# Patient Record
Sex: Male | Born: 1950 | Race: White | Hispanic: No | Marital: Married | Smoking: Current some day smoker
Health system: Southern US, Community
[De-identification: ages and names within clinical notes are randomized; demographics above are authoritative.]

## PROBLEM LIST (undated history)

## (undated) HISTORY — PX: APPENDECTOMY: SHX54

---

## 2016-06-05 DIAGNOSIS — R7689 Other specified abnormal immunological findings in serum: Secondary | ICD-10-CM

## 2016-06-05 DIAGNOSIS — M2550 Pain in unspecified joint: Secondary | ICD-10-CM

## 2016-06-05 DIAGNOSIS — R768 Other specified abnormal immunological findings in serum: Secondary | ICD-10-CM | POA: Insufficient documentation

## 2016-06-05 HISTORY — DX: Other specified abnormal immunological findings in serum: R76.89

## 2016-06-05 HISTORY — DX: Pain in unspecified joint: M25.50

## 2019-05-16 ENCOUNTER — Encounter (HOSPITAL_BASED_OUTPATIENT_CLINIC_OR_DEPARTMENT_OTHER): Payer: Self-pay | Admitting: *Deleted

## 2019-05-16 ENCOUNTER — Emergency Department (HOSPITAL_BASED_OUTPATIENT_CLINIC_OR_DEPARTMENT_OTHER): Payer: Medicare Other

## 2019-05-16 ENCOUNTER — Emergency Department (HOSPITAL_BASED_OUTPATIENT_CLINIC_OR_DEPARTMENT_OTHER)
Admission: EM | Admit: 2019-05-16 | Discharge: 2019-05-16 | Disposition: A | Discharge status: Home / Self Care | Payer: Medicare Other | Attending: Emergency Medicine | Admitting: Emergency Medicine

## 2019-05-16 ENCOUNTER — Other Ambulatory Visit: Payer: Self-pay

## 2019-05-16 DIAGNOSIS — Y999 Unspecified external cause status: Secondary | ICD-10-CM | POA: Diagnosis not present

## 2019-05-16 DIAGNOSIS — W19XXXA Unspecified fall, initial encounter: Secondary | ICD-10-CM | POA: Diagnosis not present

## 2019-05-16 DIAGNOSIS — S0990XA Unspecified injury of head, initial encounter: Secondary | ICD-10-CM | POA: Insufficient documentation

## 2019-05-16 DIAGNOSIS — Y939 Activity, unspecified: Secondary | ICD-10-CM | POA: Insufficient documentation

## 2019-05-16 DIAGNOSIS — Y92003 Bedroom of unspecified non-institutional (private) residence as the place of occurrence of the external cause: Secondary | ICD-10-CM | POA: Insufficient documentation

## 2019-05-16 DIAGNOSIS — S0033XA Contusion of nose, initial encounter: Secondary | ICD-10-CM | POA: Diagnosis not present

## 2019-05-16 DIAGNOSIS — F10929 Alcohol use, unspecified with intoxication, unspecified: Secondary | ICD-10-CM

## 2019-05-16 DIAGNOSIS — F10220 Alcohol dependence with intoxication, uncomplicated: Secondary | ICD-10-CM | POA: Diagnosis not present

## 2019-05-16 DIAGNOSIS — Y908 Blood alcohol level of 240 mg/100 ml or more: Secondary | ICD-10-CM | POA: Diagnosis not present

## 2019-05-16 DIAGNOSIS — Z79899 Other long term (current) drug therapy: Secondary | ICD-10-CM | POA: Insufficient documentation

## 2019-05-16 LAB — CBC WITH DIFFERENTIAL/PLATELET
Abs Immature Granulocytes: 0.01 10*3/uL (ref 0.00–0.07)
Basophils Absolute: 0.1 10*3/uL (ref 0.0–0.1)
Basophils Relative: 2 %
Eosinophils Absolute: 0.2 10*3/uL (ref 0.0–0.5)
Eosinophils Relative: 3 %
HCT: 40.4 % (ref 39.0–52.0)
Hemoglobin: 14 g/dL (ref 13.0–17.0)
Immature Granulocytes: 0 %
Lymphocytes Relative: 52 %
Lymphs Abs: 2.5 10*3/uL (ref 0.7–4.0)
MCH: 31.7 pg (ref 26.0–34.0)
MCHC: 34.7 g/dL (ref 30.0–36.0)
MCV: 91.6 fL (ref 80.0–100.0)
Monocytes Absolute: 0.5 10*3/uL (ref 0.1–1.0)
Monocytes Relative: 10 %
Neutro Abs: 1.6 10*3/uL — ABNORMAL LOW (ref 1.7–7.7)
Neutrophils Relative %: 33 %
Platelets: 180 10*3/uL (ref 150–400)
RBC: 4.41 MIL/uL (ref 4.22–5.81)
RDW: 12.9 % (ref 11.5–15.5)
WBC: 4.8 10*3/uL (ref 4.0–10.5)
nRBC: 0 % (ref 0.0–0.2)

## 2019-05-16 LAB — COMPREHENSIVE METABOLIC PANEL
ALT: 27 U/L (ref 0–44)
AST: 32 U/L (ref 15–41)
Albumin: 3.7 g/dL (ref 3.5–5.0)
Alkaline Phosphatase: 41 U/L (ref 38–126)
Anion gap: 10 (ref 5–15)
BUN: 14 mg/dL (ref 8–23)
CO2: 27 mmol/L (ref 22–32)
Calcium: 8.4 mg/dL — ABNORMAL LOW (ref 8.9–10.3)
Chloride: 105 mmol/L (ref 98–111)
Creatinine, Ser: 0.67 mg/dL (ref 0.61–1.24)
GFR calc Af Amer: >60 mL/min (ref >60)
GFR calc non Af Amer: >60 mL/min (ref >60)
Glucose, Bld: 84 mg/dL (ref 70–99)
Potassium: 3.6 mmol/L (ref 3.5–5.1)
Sodium: 142 mmol/L (ref 135–145)
Total Bilirubin: 0.6 mg/dL (ref 0.3–1.2)
Total Protein: 6.8 g/dL (ref 6.5–8.1)

## 2019-05-16 LAB — URINALYSIS, ROUTINE W REFLEX MICROSCOPIC
Bilirubin Urine: NEGATIVE
Glucose, UA: NEGATIVE mg/dL
Hgb urine dipstick: NEGATIVE
Ketones, ur: NEGATIVE mg/dL
Leukocytes,Ua: NEGATIVE
Nitrite: NEGATIVE
Protein, ur: NEGATIVE mg/dL
Specific Gravity, Urine: >1.03 — ABNORMAL HIGH (ref 1.005–1.030)
pH: 5.5 (ref 5.0–8.0)

## 2019-05-16 LAB — TROPONIN I (HIGH SENSITIVITY): Troponin I (High Sensitivity): 5 ng/L (ref <18)

## 2019-05-16 LAB — RAPID URINE DRUG SCREEN, HOSP PERFORMED
Amphetamines: NOT DETECTED
Barbiturates: NOT DETECTED
Benzodiazepines: NOT DETECTED
Cocaine: NOT DETECTED
Opiates: NOT DETECTED
Tetrahydrocannabinol: NOT DETECTED

## 2019-05-16 LAB — CK: Total CK: 125 U/L (ref 49–397)

## 2019-05-16 LAB — ETHANOL: Alcohol, Ethyl (B): 267 mg/dL — ABNORMAL HIGH (ref <10)

## 2019-05-16 NOTE — ED Provider Notes (Signed)
[facility] EMERGENCY DEPARTMENT Provider Note   CSN: [id number] Arrival date & time: 05/16/19  2027   History Chief Complaint  Patient presents with  . Fall    Jeffery Browning is a 69 y.o. male w/ a PMHx of psoriatic arthritis who presents to the ED after a fall.  Patient's wife was present at bedside to assist with history.  Jeffery Browning states he fell yesterday morning but cannot recall anything before, during or afterwards.  He remembers feeling at baseline on Sunday (2 days ago). He notes that the fall occurred near his bed, as that is where blood was found.  He believes he hit with his head down on the floor.  He notes a history of daily alcohol use for at least 30 years but denies ever needing eye-opener, DUI, arrests, or his drinking affecting his home/work life.  He denies any drug use.  No prior history of similar falls.   Jeffery Browning states she left the house around 6:30 AM, at which time Mr. Comer was eating breakfast at the table.  She was concerned when she did not see any movement within the house via the Ring app on her phone.  She called her son to go check up on Jeffery Browning.  At 11 AM, her son found Jeffery Browning on the bed with a bloody nose in a puddle of blood next to the bed.  [name] was confused at the time.  After speaking to a physician today, family was instructed to bring him to the ED for evaluation.  Patient's wife notes patient was shuffling gait earlier that has since resolved.  Patient denies fever, chills, shortness of breath, chest pain, facial pain, headache, blurry vision, weakness/numbness, urinary changes, N/V/D, abdominal pain.  Past Medical History:  - Psoriatic Arthritis   Past Surgical History:  Procedure Laterality Date  . APPENDECTOMY      Social History: Alcohol: Daily use of beer and wine, unable to quantify how many drinks per day. He has been drinking daily for +30 years.  Denies hx of withdrawals Tobacco: Several  cigars per week. No other tobacco use.  Drugs: Denies  Lives at home with wife.   Home Medications Prior to Admission medications   Not on File    Allergies    Patient has no known allergies.  Review of Systems   Review of Systems  Constitutional: Negative for chills and fever.  HENT: Positive for nosebleeds.   Respiratory: Negative for shortness of breath and wheezing.   Cardiovascular: Negative for chest pain, palpitations and leg swelling.  Gastrointestinal: Negative for abdominal pain, diarrhea, nausea and vomiting.  Genitourinary: Negative for difficulty urinating, dysuria and hematuria.  Musculoskeletal: Negative for back pain, myalgias, neck pain and neck stiffness.  Skin: Positive for wound (head abrasion, nose bruise).  Neurological: Positive for syncope. Negative for seizures, speech difficulty, weakness, light-headedness, numbness and headaches.  Psychiatric/Behavioral: Positive for confusion.   Physical Exam Updated Vital Signs BP (!) 136/93   Pulse 80   Temp 98.8 F (37.1 C) (Oral)   Resp 14   Ht 5\' 9"  (1.753 m)   Wt 90.7 kg   SpO2 94%   BMI 29.53 kg/m   Physical Exam Vitals and nursing note reviewed.  Constitutional:      General: He is not in acute distress.    Appearance: He is normal weight. He is not toxic-appearing.  HENT:     Head: Abrasion (0.5 x 3 cm abrasion without active  bleeding) present. No raccoon eyes, right periorbital erythema or left periorbital erythema.     Nose: Signs of injury and nasal tenderness present. No laceration.     Right Nostril: Epistaxis (minimal) present. No occlusion.     Left Nostril: Epistaxis (dried blood throughout nare) present. No occlusion.     Right Sinus: No maxillary sinus tenderness or frontal sinus tenderness.     Left Sinus: No maxillary sinus tenderness or frontal sinus tenderness.     Mouth/Throat:     Mouth: Mucous membranes are moist. No injury, lacerations or oral lesions.     Tongue: No lesions.  Tongue does not deviate from midline.     Palate: No lesions.     Pharynx: Oropharynx is clear. No oropharyngeal exudate.  Eyes:     Extraocular Movements: Extraocular movements intact.     Conjunctiva/sclera: Conjunctivae normal.     Pupils: Pupils are equal, round, and reactive to light.  Cardiovascular:     Rate and Rhythm: Normal rate and regular rhythm.     Heart sounds: No murmur.  Pulmonary:     Effort: Pulmonary effort is normal. No respiratory distress.     Breath sounds: Normal breath sounds. No wheezing, rhonchi or rales.  Chest:     Chest wall: No tenderness.  Abdominal:     General: Bowel sounds are normal. There is no distension.     Palpations: Abdomen is soft.  Musculoskeletal:        General: No signs of injury.     Right lower leg: No edema.     Left lower leg: No edema.  Skin:    General: Skin is warm and dry.     Coloration: Skin is not jaundiced or pale.     Findings: Bruising (bruise on nasal bridge) present.  Neurological:     Mental Status: He is alert. He is confused.     Cranial Nerves: Cranial nerves are intact. No cranial nerve deficit or facial asymmetry.     Sensory: Sensation is intact.     Motor: Motor function is intact. No weakness (5/5 strength bilateral upper and lower extremities in both flexion and extension), abnormal muscle tone or seizure activity.     Comments: Oriented to person, place, month but not year. Confused and unable to recall the events from yesterday  Psychiatric:        Mood and Affect: Mood normal.        Behavior: Behavior normal.    ED Results / Procedures / Treatments   Labs (all labs ordered are listed, but only abnormal results are displayed) Labs Reviewed  COMPREHENSIVE METABOLIC PANEL - Abnormal; Notable for the following components:      Result Value   Calcium 8.4 (*)    All other components within normal limits  ETHANOL - Abnormal; Notable for the following components:   Alcohol, Ethyl (B) 267 (*)    All  other components within normal limits  CBC WITH DIFFERENTIAL/PLATELET - Abnormal; Notable for the following components:   Neutro Abs 1.6 (*)    All other components within normal limits  URINALYSIS, ROUTINE W REFLEX MICROSCOPIC - Abnormal; Notable for the following components:   Specific Gravity, Urine >1.030 (*)    All other components within normal limits  RAPID URINE DRUG SCREEN, HOSP PERFORMED  CK  TROPONIN I (HIGH SENSITIVITY)  TROPONIN I (HIGH SENSITIVITY)   EKG EKG Interpretation  Date/Time:  Tuesday May 16 2019 21:12:16 EST Ventricular Rate:  78 PR  Interval:    QRS Duration: 104 QT Interval:  416 QTC Calculation: 474 R Axis:   53 Text Interpretation: Sinus rhythm No STEMI Confirmed by Alona Bene [phone]) on 05/16/2019 9:15:25 PM   Radiology DG Chest 2 View  Result Date: 05/16/2019 CLINICAL DATA:  Un witnessed fall yesterday, facial laceration and bruising EXAM: CHEST - 2 VIEW COMPARISON:  None. FINDINGS: The heart size and mediastinal contours are within normal limits. Both lungs are clear. The visualized skeletal structures are unremarkable. IMPRESSION: No active cardiopulmonary disease. Electronically Signed   By: Sharlet Salina M.D.   On: 05/16/2019 21:42   CT Head Wo Contrast  Result Date: 05/16/2019 CLINICAL DATA:  Unwitnessed fall EXAM: CT HEAD WITHOUT CONTRAST TECHNIQUE: Contiguous axial images were obtained from the base of the skull through the vertex without intravenous contrast. COMPARISON:  None. FINDINGS: Brain: No evidence of acute territorial infarction, hemorrhage, hydrocephalus,extra-axial collection or mass lesion/mass effect. There is dilatation the ventricles and sulci, more prominent in the frontotemporal lobes. Low-attenuation changes in the deep white matter consistent with small vessel ischemia. Vascular: No hyperdense vessel or unexpected calcification. Skull: The skull is intact. No fracture or focal lesion identified. Sinuses/Orbits: The visualized  paranasal sinuses and mastoid air cells are clear. The orbits and globes intact. Other: None Face: Osseous: No acute fracture or other significant osseous abnormality.The nasal bone, mandibles, zygomatic arches and pterygoid plates are intact. Orbits: No fracture identified. Unremarkable appearance of globes and orbits. Sinuses: The visualized paranasal sinuses and mastoid air cells are unremarkable. Soft tissues:  No acute findings. Limited intracranial: No acute findings. Cervical spine: Alignment: Physiologic Skull base and vertebrae: Visualized skull base is intact. No atlanto-occipital dissociation. The vertebral body heights are well maintained. No fracture or pathologic osseous lesion seen. Soft tissues and spinal canal: The visualized paraspinal soft tissues are unremarkable. No prevertebral soft tissue swelling is seen. The spinal canal is grossly unremarkable, no large epidural collection or significant canal narrowing. Disc levels: Mild cervical spine spondylosis is seen with uncovertebral osteophyte and disc osteophyte complex most notable at C4-C5 with mild neural foraminal narrowing. Upper chest: The lung apices are clear. Thoracic inlet is within normal limits. Other: None IMPRESSION: No acute intracranial abnormality. Findings consistent with age related atrophy, more prominent in the frontotemporal lobes, and chronic small vessel ischemia No acute facial injury No acute fracture or malalignment of the spine. Electronically Signed   By: Jonna Clark M.D.   On: 05/16/2019 21:53   CT Cervical Spine Wo Contrast  Result Date: 05/16/2019 CLINICAL DATA:  Unwitnessed fall EXAM: CT HEAD WITHOUT CONTRAST TECHNIQUE: Contiguous axial images were obtained from the base of the skull through the vertex without intravenous contrast. COMPARISON:  None. FINDINGS: Brain: No evidence of acute territorial infarction, hemorrhage, hydrocephalus,extra-axial collection or mass lesion/mass effect. There is dilatation the  ventricles and sulci, more prominent in the frontotemporal lobes. Low-attenuation changes in the deep white matter consistent with small vessel ischemia. Vascular: No hyperdense vessel or unexpected calcification. Skull: The skull is intact. No fracture or focal lesion identified. Sinuses/Orbits: The visualized paranasal sinuses and mastoid air cells are clear. The orbits and globes intact. Other: None Face: Osseous: No acute fracture or other significant osseous abnormality.The nasal bone, mandibles, zygomatic arches and pterygoid plates are intact. Orbits: No fracture identified. Unremarkable appearance of globes and orbits. Sinuses: The visualized paranasal sinuses and mastoid air cells are unremarkable. Soft tissues:  No acute findings. Limited intracranial: No acute findings. Cervical spine: Alignment: Physiologic Skull  base and vertebrae: Visualized skull base is intact. No atlanto-occipital dissociation. The vertebral body heights are well maintained. No fracture or pathologic osseous lesion seen. Soft tissues and spinal canal: The visualized paraspinal soft tissues are unremarkable. No prevertebral soft tissue swelling is seen. The spinal canal is grossly unremarkable, no large epidural collection or significant canal narrowing. Disc levels: Mild cervical spine spondylosis is seen with uncovertebral osteophyte and disc osteophyte complex most notable at C4-C5 with mild neural foraminal narrowing. Upper chest: The lung apices are clear. Thoracic inlet is within normal limits. Other: None IMPRESSION: No acute intracranial abnormality. Findings consistent with age related atrophy, more prominent in the frontotemporal lobes, and chronic small vessel ischemia No acute facial injury No acute fracture or malalignment of the spine. Electronically Signed   By: Jonna Clark M.D.   On: 05/16/2019 21:53   CT Maxillofacial Wo Contrast  Result Date: 05/16/2019 CLINICAL DATA:  Unwitnessed fall EXAM: CT HEAD WITHOUT  CONTRAST TECHNIQUE: Contiguous axial images were obtained from the base of the skull through the vertex without intravenous contrast. COMPARISON:  None. FINDINGS: Brain: No evidence of acute territorial infarction, hemorrhage, hydrocephalus,extra-axial collection or mass lesion/mass effect. There is dilatation the ventricles and sulci, more prominent in the frontotemporal lobes. Low-attenuation changes in the deep white matter consistent with small vessel ischemia. Vascular: No hyperdense vessel or unexpected calcification. Skull: The skull is intact. No fracture or focal lesion identified. Sinuses/Orbits: The visualized paranasal sinuses and mastoid air cells are clear. The orbits and globes intact. Other: None Face: Osseous: No acute fracture or other significant osseous abnormality.The nasal bone, mandibles, zygomatic arches and pterygoid plates are intact. Orbits: No fracture identified. Unremarkable appearance of globes and orbits. Sinuses: The visualized paranasal sinuses and mastoid air cells are unremarkable. Soft tissues:  No acute findings. Limited intracranial: No acute findings. Cervical spine: Alignment: Physiologic Skull base and vertebrae: Visualized skull base is intact. No atlanto-occipital dissociation. The vertebral body heights are well maintained. No fracture or pathologic osseous lesion seen. Soft tissues and spinal canal: The visualized paraspinal soft tissues are unremarkable. No prevertebral soft tissue swelling is seen. The spinal canal is grossly unremarkable, no large epidural collection or significant canal narrowing. Disc levels: Mild cervical spine spondylosis is seen with uncovertebral osteophyte and disc osteophyte complex most notable at C4-C5 with mild neural foraminal narrowing. Upper chest: The lung apices are clear. Thoracic inlet is within normal limits. Other: None IMPRESSION: No acute intracranial abnormality. Findings consistent with age related atrophy, more prominent in  the frontotemporal lobes, and chronic small vessel ischemia No acute facial injury No acute fracture or malalignment of the spine. Electronically Signed   By: Jonna Clark M.D.   On: 05/16/2019 21:53    Procedures Procedures (including critical care time)  Medications Ordered in ED Medications - No data to display  ED Course  I have reviewed the triage vital signs and the nursing notes.  Pertinent labs & imaging results that were available during my care of the patient were reviewed by me and considered in my medical decision making (see chart for details).    MDM Rules/Calculators/A&P                      Mr. Hineman is a 69 year old male with no significant past medical history who presents to the ED after an unwitnessed fall at home.  Fall occurred yesterday at some point between 6:30-11:00 AM. Patient was found on his bed with bleeding from the  nose and a small puddle of blood near the bed. Both the patient and the family suspect that he rolled off the bed and hit his head face down on the ground.  Differential for patient's fall includes acute alcohol intoxication versus stroke.  No acute neurological findings on examination that support possibility of stroke though; he has 5/5 strength in upper and lower extremities, CN intact, sensation intact. CT Imaging is still pending.  He does have a daily alcohol drinking history so alcohol intoxication most likely. Ethanol is acutely elevated at 267, further supporting memory loss and fall 2/2 to alcohol intoxication. Initial lab work negative for AKI, electrolyte abnormalities and rhabdomyolysis.  @ 22:19  CT head, cervical and maxillofacial are all negative for acute findings. Result of lab work including CBC, troponin, UA were unremarkable. Both fall and amnesia is likely from alcohol intoxication. Discussed with patient and his wife. He has remained hemodynamically stable, so plan to discharge home with alcohol cessation resources and return  precautions available on AVS.     Final Clinical Impression(s) / ED Diagnoses Final diagnoses:  Alcoholic intoxication with complication ([place])  Fall, initial encounter  Injury of head, initial encounter   Rx / DC Orders ED Discharge Orders    None     Dr. Jose Persia Internal Medicine PGY-1  05/16/2019, 10:21 PM    Jose Persia, MD 05/16/19 2222    Margette Fast, MD 05/17/19 2000

## 2019-05-16 NOTE — ED Triage Notes (Signed)
He was last seen normal on Sunday night. Unwitnessed fall yesterday. His son found him in the bed with blood in the floor from fall. Laceration over his right eyebrow. Bruise across his nose. He admits to alcohol use. Wife states he last drank 2 days ago as far as she is aware. He started shuffling his feet while walking today. He has been confused today.

## 2019-05-16 NOTE — Discharge Instructions (Signed)
Substance Abuse Treatment Programs  Intensive Outpatient Programs [facility]     [address]      [place], [place]                   [phone]       [facility] [address] [place], [place] [phone]  [facility]     (Inpatient and outpatient)     [address]           [phone]    [facility] [phone] (Suboxone and Methadone)  [address]      [place], [place] [address]      [phone]       [address] [place], [place] [phone]  [facility] (Outpatient/Inpatient, [place])    (insurance only) [phone]             [facility] (Groups & Residential) [place], [place] [phone]     [facility]     [address]     [place], [place]      [phone]       [facility] (for caregivers and family) [address] [place], [place] [phone]      [facility]      [address], [place], [place] [address]  [phone]       [facility] [address] [place], [place] [address] [phone]  [facility]        [phone]       [facility] [phone]  [facility]             [address]                                         [place], [place]                                                [phone] or [phone]                               [facility] [place] [address] [place], [address] [phone]  [facility]    [address]      [place], [place]     [phone]       [facility] [address] [place], [place] [phone]  [facility]   [address]     [place], [place] [address]     [phone]      Admissions: 8am-3pm M-F  [facility] ([facility] [address] [place],  [place] [phone]  [facility]: Residential Program (90 Days)   [place], [place]      [phone] or [phone]     [facility]: [facility] [place], [place] (Walk in Hours over the weekend or by referral)  [facility] [facility] [address], [place],  [address] [phone]  Crisis Mobile: [facility]:  [phone] (for crisis response 24 hours a day) [facility] Hotline:      [phone] 

## 2021-09-17 IMAGING — CT CT MAXILLOFACIAL W/O CM
3 series · 15 of 47 positions shown, 18 images · non-contrast
Comparison: None.

CLINICAL DATA: Unwitnessed fall

EXAM:
CT HEAD WITHOUT CONTRAST
TECHNIQUE: Contiguous axial images were obtained from the base of the skull
through the vertex without intravenous contrast.

[Series 2: max soft · axial · 0.41mm/px · z∈[+980,+1122]mm · 9 of 83 slices shown, 12 images]
[im 6/83  brain]
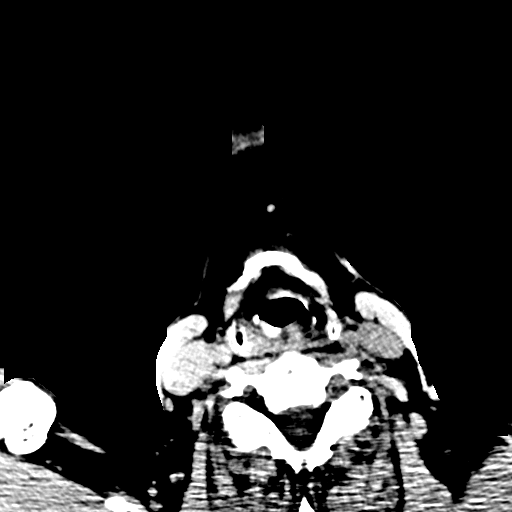
[im 6/83  bone]
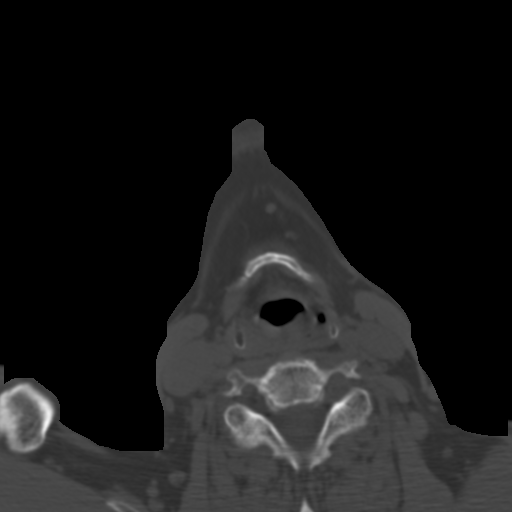
[im 15/83  bone]
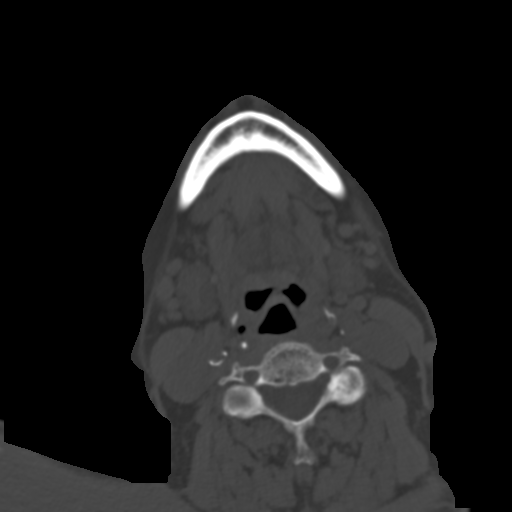
[im 23/83  bone]
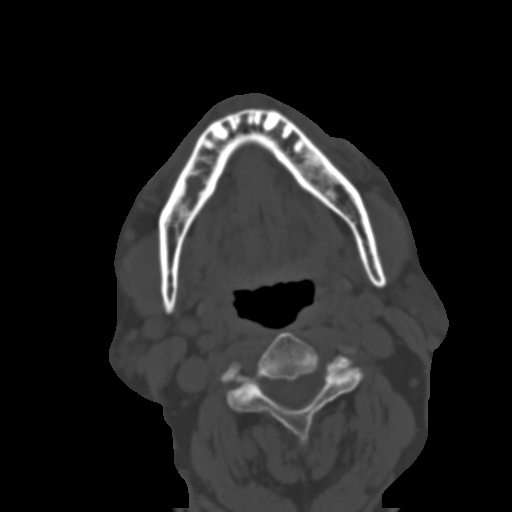
[im 32/83  bone]
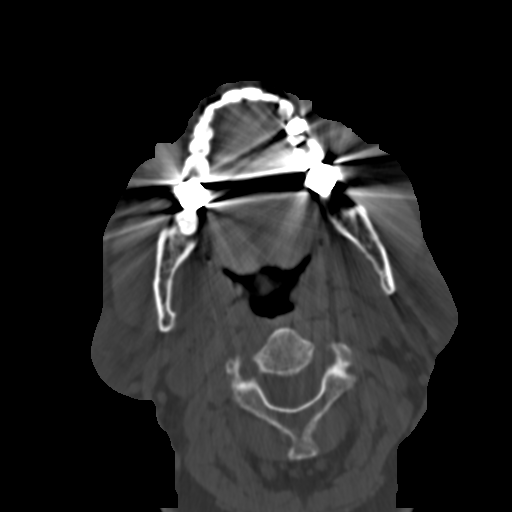
[im 43/83  brain]
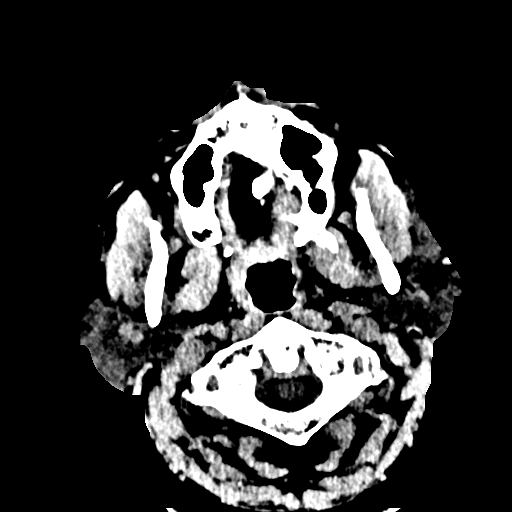
[im 43/83  bone]
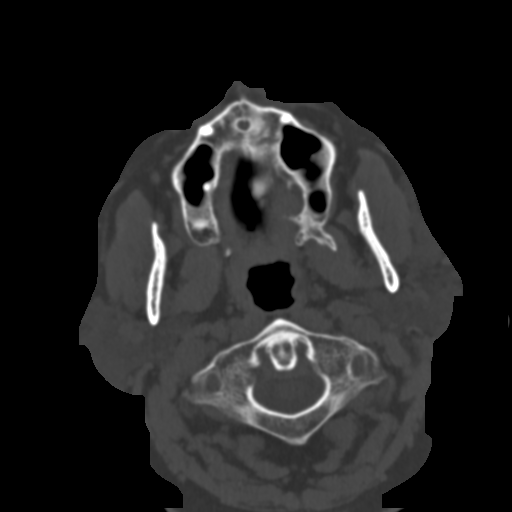
[im 51/83  bone]
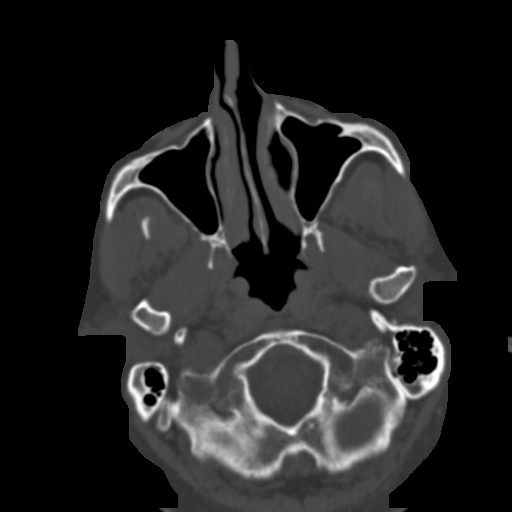
[im 60/83  bone]
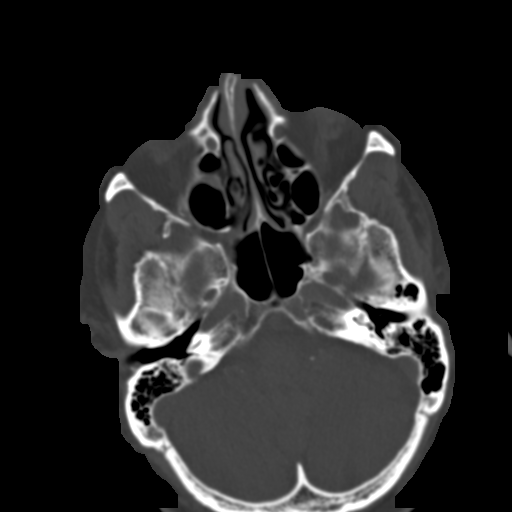
[im 68/83  bone]
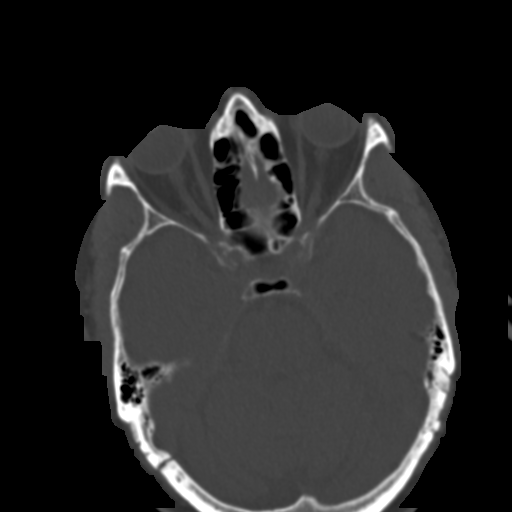
[im 77/83  brain]
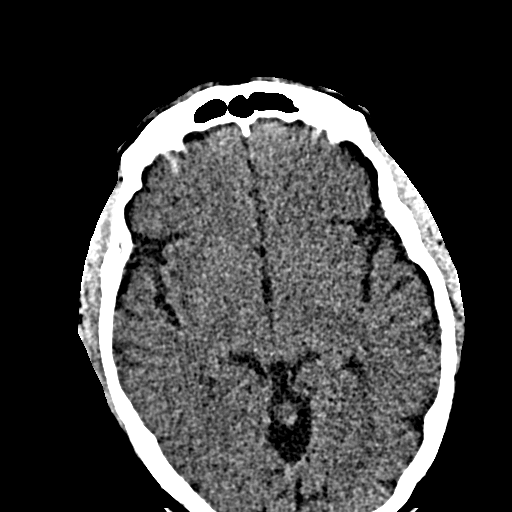
[im 77/83  bone]
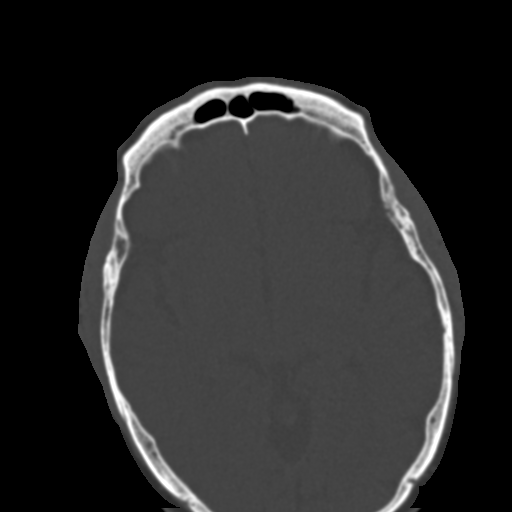

[Series 4: coronal soft · coronal · 0.37mm/px · 3 of 79 slices shown]
[im 35/79  bone]
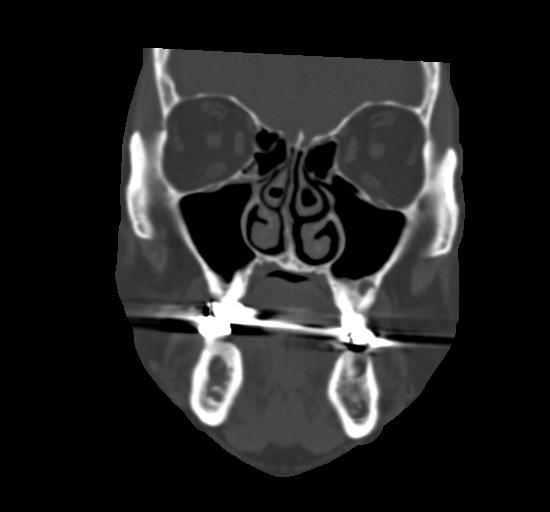
[im 44/79  bone]
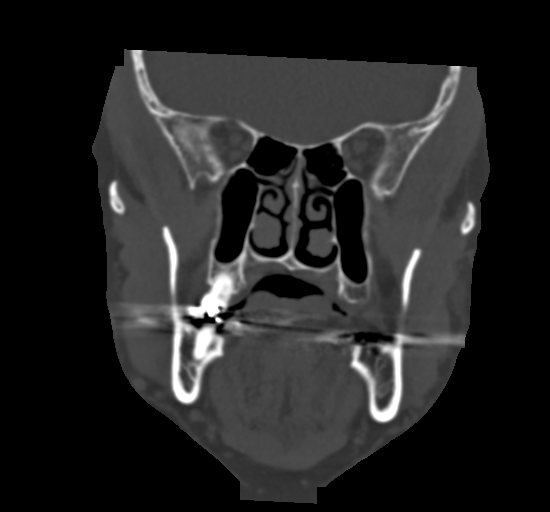
[im 53/79  bone]
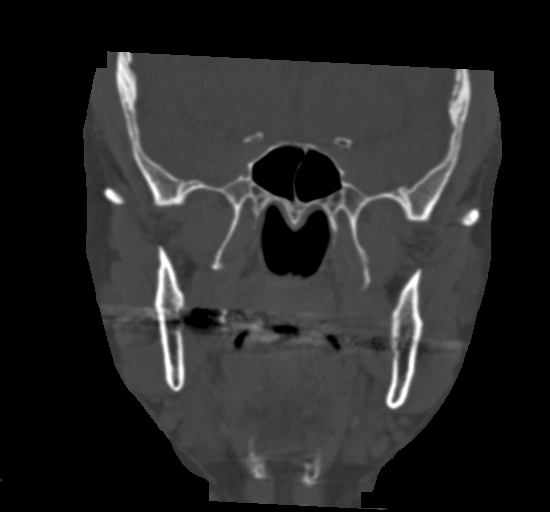

[Series 5: sagittal soft · sagittal · 0.37mm/px · 3 of 103 slices shown]
[im 35/103  bone]
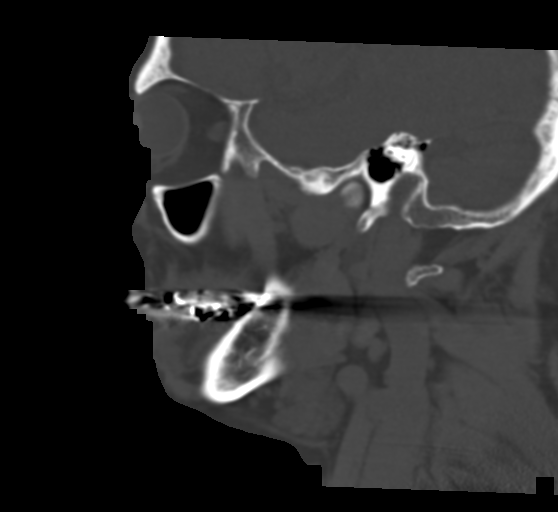
[im 52/103  bone]
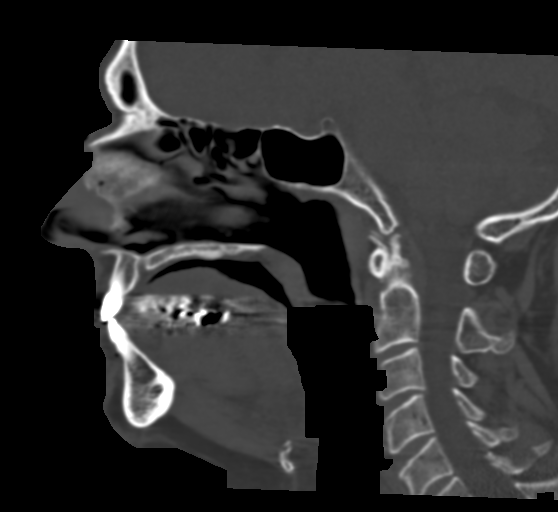
[im 69/103  bone]
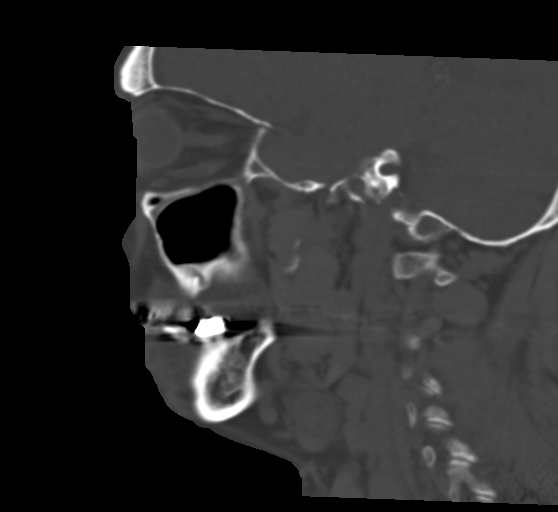

[15 of 47 positions shown; findings below may reference images not displayed]

FINDINGS: Brain: No evidence of acute territorial infarction, hemorrhage,
hydrocephalus,extra-axial collection or mass lesion/mass effect.
There is dilatation the ventricles and sulci, more prominent in the
frontotemporal lobes. Low-attenuation changes in the deep white
matter consistent with small vessel ischemia.

Vascular: No hyperdense vessel or unexpected calcification.

Skull: The skull is intact. No fracture or focal lesion identified.

Sinuses/Orbits: The visualized paranasal sinuses and mastoid air
cells are clear. The orbits and globes intact.

Other: None

Face:

Osseous: No acute fracture or other significant osseous
abnormality.The nasal bone, mandibles, zygomatic arches and
pterygoid plates are intact.

Orbits: No fracture identified. Unremarkable appearance of globes
and orbits.

Sinuses: The visualized paranasal sinuses and mastoid air cells are
unremarkable.

Soft tissues:  No acute findings.

Limited intracranial: No acute findings.

Cervical spine:

Alignment: Physiologic

Skull base and vertebrae: Visualized skull base is intact. No
atlanto-occipital dissociation. The vertebral body heights are well
maintained. No fracture or pathologic osseous lesion seen.

Soft tissues and spinal canal: The visualized paraspinal soft
tissues are unremarkable. No prevertebral soft tissue swelling is
seen. The spinal canal is grossly unremarkable, no large epidural
collection or significant canal narrowing.

Disc levels: Mild cervical spine spondylosis is seen with
uncovertebral osteophyte and disc osteophyte complex most notable at
C4-C5 with mild neural foraminal narrowing.

Upper chest: The lung apices are clear. Thoracic inlet is within
normal limits.

Other: None
IMPRESSION: No acute intracranial abnormality.

Findings consistent with age related atrophy, more prominent in the
frontotemporal lobes, and chronic small vessel ischemia

No acute facial injury

No acute fracture or malalignment of the spine.

## 2023-11-21 ENCOUNTER — Ambulatory Visit (HOSPITAL_BASED_OUTPATIENT_CLINIC_OR_DEPARTMENT_OTHER)
Admission: EM | Admit: 2023-11-21 | Discharge: 2023-11-21 | Disposition: A | Discharge status: Home / Self Care | Attending: Family Medicine | Admitting: Family Medicine

## 2023-11-21 ENCOUNTER — Encounter (HOSPITAL_BASED_OUTPATIENT_CLINIC_OR_DEPARTMENT_OTHER): Payer: Self-pay | Admitting: *Deleted

## 2023-11-21 ENCOUNTER — Ambulatory Visit (INDEPENDENT_AMBULATORY_CARE_PROVIDER_SITE_OTHER)
Admit: 2023-11-21 | Discharge: 2023-11-21 | Disposition: A | Discharge status: Home / Self Care | Attending: Family Medicine | Admitting: Family Medicine

## 2023-11-21 DIAGNOSIS — R5383 Other fatigue: Secondary | ICD-10-CM

## 2023-11-21 LAB — POCT URINE DIPSTICK
Bilirubin, UA: NEGATIVE
Blood, UA: NEGATIVE
Glucose, UA: NEGATIVE mg/dL
Leukocytes, UA: NEGATIVE
Nitrite, UA: NEGATIVE
POC PROTEIN,UA: 100 — AB
Spec Grav, UA: >=1.03 — AB (ref 1.010–1.025)
Urobilinogen, UA: 0.2 U/dL
pH, UA: 5.5 (ref 5.0–8.0)

## 2023-11-21 NOTE — ED Provider Notes (Signed)
 [facility]    CSN: [id number] Arrival date & time: 11/21/23  1101      History   Chief Complaint Chief Complaint  Patient presents with   Fatigue   appeitite loss    HPI Jeffery Browning is a 73 y.o. male.   Patient is a 73 year old gentleman presents today for fatigue, low energy and loss of appetite.  This has been present over the past couple of days.  Denies any known medical history and is not currently taking any medications.  He does admit to drinking a few pints of alcohol weekly.  He has not seen a doctor in a long time or had any recent blood work done.  He denies any associated symptoms to include dizziness, lightheadedness, headache, blurred vision, chest pain, shortness of breath, fever, cough, chest congestion, abdominal pain, back pain, nausea, vomiting or diarrhea.     History reviewed. No pertinent past medical history.  There are no active problems to display for this patient.   Past Surgical History:  Procedure Laterality Date   APPENDECTOMY         Home Medications    Prior to Admission medications   Not on File    Family History History reviewed. No pertinent family history.  Social History Social History   Tobacco Use   Smoking status: Every Day    Types: Cigars   Smokeless tobacco: Never  Vaping Use   Vaping status: Never Used  Substance Use Topics   Alcohol use: Yes    Alcohol/week: 12.0 standard drinks of alcohol    Types: 12 Cans of beer per week    Comment: 12 beers and a couple pints of vodka weekly   Drug use: Never     Allergies   Patient has no known allergies.   Review of Systems Review of Systems See HPI  Physical Exam Triage Vital Signs ED Triage Vitals  Encounter Vitals Group     BP 11/21/23 1122 119/77     Girls Systolic BP Percentile --      Girls Diastolic BP Percentile --      Boys Systolic BP Percentile --      Boys Diastolic BP Percentile --      Pulse Rate 11/21/23 1122 75     Resp  11/21/23 1122 20     Temp 11/21/23 1122 98.3 F (36.8 C)     Temp Source 11/21/23 1122 Oral     SpO2 11/21/23 1122 95 %     Weight 11/21/23 1119 209 lb 3.2 oz (94.9 kg)     Height 11/21/23 1119 5' 10 (1.778 m)     Head Circumference --      Peak Flow --      Pain Score 11/21/23 1118 0     Pain Loc --      Pain Education --      Exclude from Growth Chart --    No data found.  Updated Vital Signs BP 119/77 (BP Location: Right Arm)   Pulse 75   Temp 98.3 F (36.8 C) (Oral)   Resp 20   Ht 5' 10 (1.778 m)   Wt 209 lb 3.2 oz (94.9 kg)   SpO2 95%   BMI 30.02 kg/m   Visual Acuity Right Eye Distance:   Left Eye Distance:   Bilateral Distance:    Right Eye Near:   Left Eye Near:    Bilateral Near:     Physical Exam Vitals and nursing note  reviewed.  Constitutional:      General: He is not in acute distress.    Appearance: Normal appearance. He is not ill-appearing, toxic-appearing or diaphoretic.  HENT:     Nose: Nose normal.     Mouth/Throat:     Pharynx: Oropharynx is clear.  Eyes:     General: No scleral icterus.    Extraocular Movements: Extraocular movements intact.     Pupils: Pupils are equal, round, and reactive to light.     Comments: Bilateral scleral injection   Cardiovascular:     Rate and Rhythm: Normal rate and regular rhythm.     Pulses: Normal pulses.     Heart sounds: Normal heart sounds.  Pulmonary:     Effort: Pulmonary effort is normal.     Breath sounds: Normal breath sounds.  Abdominal:     Palpations: Abdomen is soft.     Tenderness: There is no abdominal tenderness.  Musculoskeletal:        General: Normal range of motion.     Cervical back: Normal range of motion.  Lymphadenopathy:     Cervical: No cervical adenopathy.  Skin:    General: Skin is warm and dry.     Coloration: Skin is not jaundiced.  Neurological:     General: No focal deficit present.     Mental Status: He is alert.  Psychiatric:        Mood and Affect: Mood  normal.      UC Treatments / Results  Labs (all labs ordered are listed, but only abnormal results are displayed) Labs Reviewed  POCT URINE DIPSTICK - Abnormal; Notable for the following components:      Result Value   Ketones, POC UA moderate (40) (*)    Spec Grav, UA >=1.030 (*)    POC PROTEIN,UA =100 (*)    All other components within normal limits  COMPREHENSIVE METABOLIC PANEL WITH GFR  CBC WITH DIFFERENTIAL/PLATELET  TSH    EKG   Radiology No results found.  Procedures Procedures (including critical care time)  Medications Ordered in UC Medications - No data to display  Initial Impression / Assessment and Plan / UC Course  I have reviewed the triage vital signs and the nursing notes.  Pertinent labs & imaging results that were available during my care of the patient were reviewed by me and considered in my medical decision making (see chart for details).     Fatigue and loss of appetite-patient presents with fatigue and loss of appetite over the past 2 to 3 days.  Denies any other concerning signs or symptoms.  No red flags on exam.  Patient is an alcoholic.  Urine here was not infected but he does appear to be dehydrated.  Recommend increase fluid intake and electrolyte intake. Chest x-ray without any concern for infection but did mention hiatal hernia Checking CBC, CMP and TSH. Patient has not been to the doctor in a long time nor had any blood work done.  His vital signs are normal here today with normal blood pressure. Patient needs follow-up and healthcare maintenance with primary care doctor.  Information given to schedule appointment to follow-up with them for further management and workup if needed. Final Clinical Impressions(s) / UC Diagnoses   Final diagnoses:  Other fatigue     Discharge Instructions      Your urine did not show any infection but you do appear to be dehydrated.  Make sure you are drinking plenty of water and electrolytes.  You  may want to start taking some B vitamins and thiamine due to potential vitamin deficiency from drinking alcohol. We are obtaining some lab work and we will call you with any concerning results. I recommend that you follow-up with a primary care for continuous care. I will call you if there is anything concerning on your chest x-ray.    ED Prescriptions   None    PDMP not reviewed this encounter.   Adah Wilbert LABOR, FNP 11/21/23 1314

## 2023-11-21 NOTE — Discharge Instructions (Signed)
 Your urine did not show any infection but you do appear to be dehydrated.  Make sure you are drinking plenty of water and electrolytes.  You may want to start taking some B vitamins and thiamine due to potential vitamin deficiency from drinking alcohol. We are obtaining some lab work and we will call you with any concerning results. I recommend that you follow-up with a primary care for continuous care. I will call you if there is anything concerning on your chest x-ray.

## 2023-11-21 NOTE — ED Triage Notes (Signed)
 Patient states fatigue and loss of energy and appetite for 3 days.  States he will get food but can't eat it, he's having trouble stating why.  Denies pain or nausea

## 2023-11-24 ENCOUNTER — Ambulatory Visit (HOSPITAL_COMMUNITY): Payer: Self-pay

## 2023-11-24 LAB — CBC WITH DIFFERENTIAL/PLATELET
Basophils Absolute: 0.1 x10E3/uL (ref 0.0–0.2)
Basos: 1 %
EOS (ABSOLUTE): 0 x10E3/uL (ref 0.0–0.4)
Eos: 1 %
Hematocrit: 45.7 % (ref 37.5–51.0)
Hemoglobin: 14.2 g/dL (ref 13.0–17.7)
Immature Grans (Abs): 0.1 x10E3/uL (ref 0.0–0.1)
Immature Granulocytes: 1 %
Lymphocytes Absolute: 1 x10E3/uL (ref 0.7–3.1)
Lymphs: 23 %
MCH: 30.7 pg (ref 26.6–33.0)
MCHC: 31.1 g/dL — ABNORMAL LOW (ref 31.5–35.7)
MCV: 99 fL — ABNORMAL HIGH (ref 79–97)
Monocytes Absolute: 0.3 x10E3/uL (ref 0.1–0.9)
Monocytes: 7 %
Neutrophils Absolute: 3 x10E3/uL (ref 1.4–7.0)
Neutrophils: 67 %
Platelets: 110 x10E3/uL — ABNORMAL LOW (ref 150–450)
RBC: 4.62 x10E6/uL (ref 4.14–5.80)
RDW: 13.5 % (ref 11.6–15.4)
WBC: 4.5 x10E3/uL (ref 3.4–10.8)

## 2023-11-24 LAB — COMPREHENSIVE METABOLIC PANEL WITH GFR
ALT: 89 IU/L — ABNORMAL HIGH (ref 0–44)
AST: 141 IU/L — ABNORMAL HIGH (ref 0–40)
Albumin: 4.5 g/dL (ref 3.8–4.8)
Alkaline Phosphatase: 59 IU/L (ref 44–121)
BUN/Creatinine Ratio: 15 (ref 10–24)
BUN: 14 mg/dL (ref 8–27)
Bilirubin Total: 0.5 mg/dL (ref 0.0–1.2)
CO2: 18 mmol/L — ABNORMAL LOW (ref 20–29)
Calcium: 9.1 mg/dL (ref 8.6–10.2)
Chloride: 92 mmol/L — ABNORMAL LOW (ref 96–106)
Creatinine, Ser: 0.94 mg/dL (ref 0.76–1.27)
Globulin, Total: 2.7 g/dL (ref 1.5–4.5)
Glucose: 62 mg/dL — ABNORMAL LOW (ref 70–99)
Potassium: 4.4 mmol/L (ref 3.5–5.2)
Sodium: 140 mmol/L (ref 134–144)
Total Protein: 7.2 g/dL (ref 6.0–8.5)
eGFR: 87 mL/min/1.73

## 2023-11-24 LAB — TSH: TSH: 0.84 u[IU]/mL (ref 0.450–4.500)

## 2023-11-25 ENCOUNTER — Ambulatory Visit (HOSPITAL_BASED_OUTPATIENT_CLINIC_OR_DEPARTMENT_OTHER): Admission: EM | Admit: 2023-11-25 | Discharge: 2023-11-25 | Disposition: A | Discharge status: Home / Self Care

## 2023-11-25 ENCOUNTER — Encounter (HOSPITAL_BASED_OUTPATIENT_CLINIC_OR_DEPARTMENT_OTHER): Payer: Self-pay | Admitting: Student

## 2023-11-25 ENCOUNTER — Other Ambulatory Visit (HOSPITAL_BASED_OUTPATIENT_CLINIC_OR_DEPARTMENT_OTHER): Payer: Self-pay

## 2023-11-25 ENCOUNTER — Ambulatory Visit (INDEPENDENT_AMBULATORY_CARE_PROVIDER_SITE_OTHER)
Admission: RE | Admit: 2023-11-25 | Discharge: 2023-11-25 | Disposition: A | Discharge status: Home / Self Care | Source: Ambulatory Visit | Attending: Student | Admitting: Student

## 2023-11-25 ENCOUNTER — Ambulatory Visit (INDEPENDENT_AMBULATORY_CARE_PROVIDER_SITE_OTHER): Admitting: Student

## 2023-11-25 VITALS — BP 151/85 | HR 89 | Temp 98.2°F | Resp 16 | Ht 67.13 in | Wt 210.4 lb

## 2023-11-25 DIAGNOSIS — K760 Fatty (change of) liver, not elsewhere classified: Secondary | ICD-10-CM | POA: Insufficient documentation

## 2023-11-25 DIAGNOSIS — F1010 Alcohol abuse, uncomplicated: Secondary | ICD-10-CM

## 2023-11-25 DIAGNOSIS — R410 Disorientation, unspecified: Secondary | ICD-10-CM | POA: Insufficient documentation

## 2023-11-25 DIAGNOSIS — R21 Rash and other nonspecific skin eruption: Secondary | ICD-10-CM

## 2023-11-25 DIAGNOSIS — Z7689 Persons encountering health services in other specified circumstances: Secondary | ICD-10-CM

## 2023-11-25 HISTORY — DX: Fatty (change of) liver, not elsewhere classified: K76.0

## 2023-11-25 HISTORY — DX: Disorientation, unspecified: R41.0

## 2023-11-25 HISTORY — DX: Alcohol abuse, uncomplicated: F10.10

## 2023-11-25 HISTORY — DX: Rash and other nonspecific skin eruption: R21

## 2023-11-25 MED ORDER — PREDNISONE 10 MG (21) PO TBPK
ORAL_TABLET | ORAL | 0 refills | Status: DC
  Filled 2023-11-25: qty 21, 6d supply, fill #0

## 2023-11-25 NOTE — Assessment & Plan Note (Addendum)
 Rash with plaques and welts since Friday or Saturday, worsening over time. Not painful or itchy, no recent med start, last steroids were about 2 months ago per wife. History of psoriatic arthritis. Multiple psoriatic type plaques but the eruption of maculopapular rash raises concern for other etiology. Patient seemed to be an unreliable historian- concern for confusion 2/2 alcoholism. - Administer another steroid dose pack - Refer to dermatologist for further evaluation - Instruct to return in one week for re-evaluation - Advise to go to the hospital if bubbles form on the skin

## 2023-11-25 NOTE — Assessment & Plan Note (Signed)
 Chronic heavy alcohol use for approximately 20 years, consuming 3-4 pints and a few beers weekly. Wife disapproves of drinking. Long-term heavy alcohol use raises concern for liver damage.  Nausea and vomiting since Saturday, initially with inability to retain food or water, leading to dry heaves. Improvement noted. No signs of infection on prior CBC. - Order labs to assess current status and for assessment of ammonemia

## 2023-11-25 NOTE — Assessment & Plan Note (Signed)
 Patient seemed to be an unreliable historian- concern for confusion 2/2 alcoholism.

## 2023-11-25 NOTE — Assessment & Plan Note (Addendum)
 Noted on prior CT from 2018. Concern for liver dysfunction due to chronic heavy alcohol use. Labs indicate potential liver issues. Imaging of the liver is necessary to assess the extent of damage. - Order liver imaging - Refer to hepatologist or GI specialist if imaging indicates liver damage

## 2023-11-25 NOTE — Patient Instructions (Signed)
 It was nice to see you today!  As we discussed in clinic:  I have sent in a steroid pack to help the rash go away.  I will let you know how your labs come back.  I will try to let you know if we can get you in with a dermatologist soon.  If you have any problems before your next visit feel free to message me via MyChart (minor issues or questions) or call the office, otherwise you may reach out to schedule an office visit.  Thank you! Gaje Tennyson, PA-C

## 2023-11-25 NOTE — Progress Notes (Signed)
 New Patient Office Visit  Subjective    Patient ID: Jeffery Browning, male    DOB: 1950-12-30  Age: 73 y.o. MRN: [id number]  CC:  Chief Complaint  Patient presents with   Establish Care    Here to establish care.   Fatigue    Started feeling bad Saturday, came Sunday and saw UC. Had an X-ray done.Felt really bad Sunday, Monday, and Tuesday. Was trying to eat but kept throwing it up. Drinking gatorade & smart water. Had some welps a little bit ago that came up, gave him some rx (unsure) took some blood to make sure he was not bit by a tick, everything was negative. This was at [facility], [address].    Discussed the use of AI scribe software for clinical note transcription with the patient, who gave verbal consent to proceed.  History of Present Illness   Jeffery Browning is a 73 year old male with alcohol use disorder who presents for establishment of care with a widespread rash for evaluation.  He has been experiencing nausea and vomiting since Saturday, November 20, 2023, which worsened, leading to a visit to urgent care on Sunday, November 21, 2023. He is unable to eat and has difficulty keeping fluids down, resulting in dry heaves. There has been some improvement in symptoms over the past two days.  He has a history of psoriatic arthritis with flare-ups that improve with sun exposure or tanning bed use. He developed a rash on Saturday, November 20, 2023, which has worsened. The rash is red, not painful or itchy. He previously had welts that improved with a steroid shot and dose pack.   Elevated LFTs- Patient had elevated LFTs on last labs available. Patient has not had a liver u/s done which showed n/a. No history of IV drug use and no new medications or antibiotics recently. Elastography has not been done. Patient drinks 3-4 pints and a few beers weekly. He has significant alcohol use, consuming three to four pints and a few beers weekly. He started drinking at age 29 and increased his  consumption about 20 years ago while working out of town. His wife disapproves of his drinking habits. Patient denies history of IVDU.  Fibrosis 4 Score = 9.65        Outpatient Encounter Medications as of 11/25/2023  Medication Sig   predniSONE  (STERAPRED UNI-PAK 21 TAB) 10 MG (21) TBPK tablet Take by mouth as directed on the packaging.   No facility-administered encounter medications on file as of 11/25/2023.    History reviewed. No pertinent past medical history.  Past Surgical History:  Procedure Laterality Date   APPENDECTOMY      Family History  Problem Relation Age of Onset   Healthy Mother    Heart attack Father        at 22    Social History   Socioeconomic History   Marital status: Married    Spouse name: Not on file   Number of children: 2   Years of education: Not on file   Highest education level: Not on file  Occupational History   Not on file  Tobacco Use   Smoking status: Some Days    Types: Cigars    Passive exposure: Past   Smokeless tobacco: Never  Vaping Use   Vaping status: Never Used  Substance and Sexual Activity   Alcohol use: Yes    Alcohol/week: 12.0 standard drinks of alcohol    Types: 12 Cans of beer per  week    Comment: 12 beers and a couple pints of vodka weekly sometimes more or less   Drug use: Never   Sexual activity: Not on file  Other Topics Concern   Not on file  Social History Narrative   Not on file   Social Drivers of Health   Financial Resource Strain: Not on file  Food Insecurity: No Food Insecurity (11/25/2023)   Hunger Vital Sign    Worried About Running Out of Food in the Last Year: Never true    Ran Out of Food in the Last Year: Never true  Transportation Needs: No Transportation Needs (11/25/2023)   PRAPARE - Administrator, Civil Service (Medical): No    Lack of Transportation (Non-Medical): No  Physical Activity: Not on file  Stress: Not on file  Social Connections: Not on file  Intimate  Partner Violence: Not At Risk (11/25/2023)   Humiliation, Afraid, Rape, and Kick questionnaire    Fear of Current or Ex-Partner: No    Emotionally Abused: No    Physically Abused: No    Sexually Abused: No    ROS  Per HPI      Objective    BP (!) 151/85   Pulse 89   Temp 98.2 F (36.8 C) (Oral)   Resp 16   Ht 5' 7.13 (1.705 m)   Wt 210 lb 6.4 oz (95.4 kg)   SpO2 98%   BMI 32.83 kg/m   Physical Exam Constitutional:      General: He is not in acute distress.    Appearance: Normal appearance. He is not ill-appearing.  HENT:     Head: Normocephalic and atraumatic.     Right Ear: External ear normal.     Left Ear: External ear normal.     Nose: Nose normal.  Eyes:     Conjunctiva/sclera: Conjunctivae normal.  Cardiovascular:     Rate and Rhythm: Normal rate and regular rhythm.     Pulses: Normal pulses.     Heart sounds: Normal heart sounds. No murmur heard.    No friction rub.  Pulmonary:     Effort: Pulmonary effort is normal. No respiratory distress.     Breath sounds: Normal breath sounds. No wheezing, rhonchi or rales.  Skin:    General: Skin is warm and dry.     Coloration: Skin is not jaundiced or pale.     Findings: Rash (maculopapular rash widespread down to hands and down legs. Multiple erythematous plaques alongside rash.) present.     Comments: Negative nikolsky sign. Non-pruritic. Blanching.  Neurological:     Mental Status: He is alert.  Psychiatric:        Mood and Affect: Mood normal.        Behavior: Behavior normal.         Assessment & Plan:   Encounter to establish care  Confusion Assessment & Plan: Patient seemed to be an unreliable historian- concern for confusion 2/2 alcoholism.  Orders: -     Ammonia -     Vitamin B1 -     B12 and Folate Panel  Alcohol abuse, continuous Assessment & Plan: Chronic heavy alcohol use for approximately 20 years, consuming 3-4 pints and a few beers weekly. Wife disapproves of drinking.  Long-term heavy alcohol use raises concern for liver damage.  Nausea and vomiting since Saturday, initially with inability to retain food or water, leading to dry heaves. Improvement noted. No signs of infection on prior CBC. -  Order labs to assess current status and for assessment of ammonemia  Orders: -     Ammonia -     Vitamin B1 -     B12 and Folate Panel -     US  ABDOMEN RUQ W/ELASTOGRAPHY; Future  Rash Assessment & Plan: Rash with plaques and welts since Friday or Saturday, worsening over time. Not painful or itchy, no recent med start, last steroids were about 2 months ago per wife. History of psoriatic arthritis. Multiple psoriatic type plaques but the eruption of maculopapular rash raises concern for other etiology. Patient seemed to be an unreliable historian- concern for confusion 2/2 alcoholism. - Administer another steroid dose pack - Refer to dermatologist for further evaluation - Instruct to return in one week for re-evaluation - Advise to go to the hospital if bubbles form on the skin  Orders: -     B12 and Folate Panel -     CBC with Differential/Platelet -     predniSONE ; Take by mouth as directed on the packaging.  Dispense: 21 each; Refill: 0  Hepatic steatosis Assessment & Plan: Noted on prior CT from 2018. Concern for liver dysfunction due to chronic heavy alcohol use. Labs indicate potential liver issues. Imaging of the liver is necessary to assess the extent of damage. - Order liver imaging - Refer to hepatologist or GI specialist if imaging indicates liver damage   Orders: -     US  ABDOMEN RUQ W/ELASTOGRAPHY; Future   Return in about 1 week (around 12/02/2023).   Nga Rabon T Linzi Ohlinger, PA-C

## 2023-11-26 ENCOUNTER — Ambulatory Visit (HOSPITAL_BASED_OUTPATIENT_CLINIC_OR_DEPARTMENT_OTHER): Payer: Self-pay | Admitting: Student

## 2023-11-26 LAB — CBC WITH DIFFERENTIAL/PLATELET
Basophils Absolute: 0.1 x10E3/uL (ref 0.0–0.2)
Basos: 1 %
EOS (ABSOLUTE): 0.3 x10E3/uL (ref 0.0–0.4)
Eos: 3 %
Hematocrit: 40.6 % (ref 37.5–51.0)
Hemoglobin: 13.8 g/dL (ref 13.0–17.7)
Immature Grans (Abs): 0 x10E3/uL (ref 0.0–0.1)
Immature Granulocytes: 0 %
Lymphocytes Absolute: 1.2 x10E3/uL (ref 0.7–3.1)
Lymphs: 15 %
MCH: 31.8 pg (ref 26.6–33.0)
MCHC: 34 g/dL (ref 31.5–35.7)
MCV: 94 fL (ref 79–97)
Monocytes Absolute: 1 x10E3/uL — ABNORMAL HIGH (ref 0.1–0.9)
Monocytes: 12 %
Neutrophils Absolute: 5.4 x10E3/uL (ref 1.4–7.0)
Neutrophils: 69 %
Platelets: 104 x10E3/uL — ABNORMAL LOW (ref 150–450)
RBC: 4.34 x10E6/uL (ref 4.14–5.80)
RDW: 13 % (ref 11.6–15.4)
WBC: 7.9 x10E3/uL (ref 3.4–10.8)

## 2023-11-26 LAB — B12 AND FOLATE PANEL
Folate: >20 ng/mL (ref >3.0)
Vitamin B-12: >2000 pg/mL — ABNORMAL HIGH (ref 232–1245)

## 2023-11-29 ENCOUNTER — Telehealth (HOSPITAL_BASED_OUTPATIENT_CLINIC_OR_DEPARTMENT_OTHER): Payer: Self-pay | Admitting: Student

## 2023-11-29 NOTE — Telephone Encounter (Signed)
 Copied from CRM #[id number]. Topic: Clinical - Lab/Test Results >> Nov 29, 2023 10:44 AM Willma SAUNDERS wrote: Reason for CRM: Patient returning call from [place]. Is requesting a callback to discuss his labs.  Patient can be reached at [phone]

## 2023-12-01 ENCOUNTER — Other Ambulatory Visit (HOSPITAL_BASED_OUTPATIENT_CLINIC_OR_DEPARTMENT_OTHER): Admitting: Radiology

## 2023-12-01 LAB — VITAMIN B1: Thiamine: 110.7 nmol/L (ref 66.5–200.0)

## 2023-12-01 LAB — AMMONIA: Ammonia: 50 ug/dL (ref 31–169)

## 2023-12-02 ENCOUNTER — Ambulatory Visit (HOSPITAL_BASED_OUTPATIENT_CLINIC_OR_DEPARTMENT_OTHER): Admitting: Student

## 2023-12-03 ENCOUNTER — Ambulatory Visit (INDEPENDENT_AMBULATORY_CARE_PROVIDER_SITE_OTHER): Admitting: Student

## 2023-12-03 ENCOUNTER — Encounter (HOSPITAL_BASED_OUTPATIENT_CLINIC_OR_DEPARTMENT_OTHER): Payer: Self-pay | Admitting: Student

## 2023-12-03 VITALS — BP 156/91 | HR 66 | Temp 97.5°F | Resp 16 | Ht 67.0 in | Wt 216.2 lb

## 2023-12-03 DIAGNOSIS — L409 Psoriasis, unspecified: Secondary | ICD-10-CM | POA: Diagnosis not present

## 2023-12-03 DIAGNOSIS — F1021 Alcohol dependence, in remission: Secondary | ICD-10-CM

## 2023-12-03 DIAGNOSIS — K760 Fatty (change of) liver, not elsewhere classified: Secondary | ICD-10-CM | POA: Diagnosis not present

## 2023-12-03 NOTE — Patient Instructions (Signed)
 It was nice to see you today!  As we discussed in clinic:  I will let you know what to do for the rash this afternoon.  If you have any problems before your next visit feel free to message me via MyChart (minor issues or questions) or call the office, otherwise you may reach out to schedule an office visit.  Thank you! Montie Swiderski, PA-C

## 2023-12-03 NOTE — Progress Notes (Signed)
 Established Patient Office Visit  Subjective   Patient ID: Jeffery Browning, male    DOB: 1951/04/03  Age: 73 y.o. MRN: [id number]  Chief Complaint  Patient presents with   Medical Management of Chronic Issues    Follow up. Saw Derm Monday but rash has spread more. Face and arms are dry and skin burns. Derm thought he had a reaction to something. Finished steroid tablets. Derm gave him triamcinolone cream. They did a biopsy and have not heard back yet. Hands are swollen. Cannot open a water bottle without having to use a rag. Legs are bothering him. Feels like he has a pinched nerve. Wife feels like he is breathing heavy.     HPI  Discussed the use of AI scribe software for clinical note transcription with the patient, who gave verbal consent to proceed.  History of Present Illness   Jeffery Browning is a 73 year old male who presents with a worsening skin reaction and swelling. He is accompanied by Deane, his wife.  He has a worsening skin condition that began with a reaction noted by dermatology earlier in the week. The skin started to peel and spread, resembling a severe sunburn, although he has not been exposed to the sun. The condition has progressed since Wednesday, with peeling over the torso and arms, and a burning sensation without itching. No blister formation is noted. A biopsy was performed by dermatology, and results are pending. He has not contacted dermatology since the condition worsened and is scheduled for a follow-up on the 17th of next month.  He reports swelling in his hands and arms, which he describes as feeling like a bad sunburn. No fever, chills, sweats, or blisters are present. He has been on steroids, but they have not provided noticeable relief. He was also given a cream, but it did not meet his expectations for improvement.  He has a history of alcohol use but reports abstaining since the last visit and denies any withdrawal symptoms such as seizures or  blackout spells. Recent blood work showed low platelet levels, and an ultrasound was performed recently. Deane mentioned a concerning urine test result from urgent care, which may have been related to dehydration.      Patient Active Problem List   Diagnosis Date Noted   Confusion 11/25/2023   Rash 11/25/2023   Alcohol abuse, continuous 11/25/2023   Hepatic steatosis 11/25/2023   Positive ANA (antinuclear antibody) 06/05/2016   Multiple joint pain 06/05/2016   History reviewed. No pertinent past medical history. Social History   Tobacco Use   Smoking status: Some Days    Types: Cigars    Passive exposure: Past   Smokeless tobacco: Never  Vaping Use   Vaping status: Never Used  Substance Use Topics   Alcohol use: Not Currently    Alcohol/week: 12.0 standard drinks of alcohol    Types: 12 Cans of beer per week    Comment: QUIT DRINKING 11/19/2023. 12 beers and a couple pints of vodka weekly sometimes more or less   Drug use: Never   No Known Allergies    ROS Per HPI.    Objective:     BP (!) 156/91   Pulse 66   Temp (!) 97.5 F (36.4 C) (Oral)   Resp 16   Ht 5' 7 (1.702 m)   Wt 216 lb 3.2 oz (98.1 kg)   SpO2 99%   BMI 33.86 kg/m  BP Readings from Last 3 Encounters:  12/03/23 ROLLEN)  156/91  11/25/23 (!) 151/85  11/21/23 119/77   Wt Readings from Last 3 Encounters:  12/03/23 216 lb 3.2 oz (98.1 kg)  11/25/23 210 lb 6.4 oz (95.4 kg)  11/21/23 209 lb 3.2 oz (94.9 kg)      Physical Exam Constitutional:      General: He is not in acute distress.    Appearance: Normal appearance. He is not ill-appearing.  HENT:     Head: Normocephalic and atraumatic.     Right Ear: External ear normal.     Left Ear: External ear normal.     Nose: Nose normal.  Eyes:     Conjunctiva/sclera: Conjunctivae normal.  Cardiovascular:     Rate and Rhythm: Normal rate and regular rhythm.     Pulses: Normal pulses.     Heart sounds: Normal heart sounds. No murmur heard.    No  friction rub.  Pulmonary:     Effort: Pulmonary effort is normal. No respiratory distress.     Breath sounds: Normal breath sounds. No wheezing, rhonchi or rales.  Musculoskeletal:     Comments: Slight swelling of bilateral hands.  Skin:    General: Skin is warm and dry.     Coloration: Skin is not jaundiced or pale.     Findings: Rash (Not consistent with SJS) present.     Comments: Rash is widespread across almost the entire body surface with major skin shedding especially on trunk and bilateral UE. No bullae. Palms are not spared. Rash is blanching to palpation. No current signs of infection noted.  Neurological:     Mental Status: He is alert.  Psychiatric:        Mood and Affect: Mood normal.        Behavior: Behavior normal.      No results found for any visits on 12/03/23.  Last CBC Lab Results  Component Value Date   WBC 7.9 11/25/2023   HGB 13.8 11/25/2023   HCT 40.6 11/25/2023   MCV 94 11/25/2023   MCH 31.8 11/25/2023   RDW 13.0 11/25/2023   PLT 104 (L) 11/25/2023   Last metabolic panel Lab Results  Component Value Date   GLUCOSE 62 (L) 11/21/2023   NA 140 11/21/2023   K 4.4 11/21/2023   CL 92 (L) 11/21/2023   CO2 18 (L) 11/21/2023   BUN 14 11/21/2023   CREATININE 0.94 11/21/2023   EGFR 87 11/21/2023   CALCIUM 9.1 11/21/2023   PROT 7.2 11/21/2023   ALBUMIN 4.5 11/21/2023   LABGLOB 2.7 11/21/2023   BILITOT 0.5 11/21/2023   ALKPHOS 59 11/21/2023   AST 141 (H) 11/21/2023   ALT 89 (H) 11/21/2023   ANIONGAP 10 05/16/2019   Last lipids No results found for: CHOL, HDL, LDLCALC, LDLDIRECT, TRIG, CHOLHDL Last hemoglobin A1c No results found for: HGBA1C    The ASCVD Risk score (Arnett DK, et al., 2019) failed to calculate for the following reasons:   Cannot find a previous HDL lab   Cannot find a previous total cholesterol lab    Assessment & Plan:   Assessment and Plan    Unspecified Severe Psoriasis Severe dermatologic reaction  characterized by skin peeling and swelling, resembling a severe sunburn, spreading from the torso to the arms and legs. No blisters present. Not responsive to steroids. Suspected allergic or inflammatory response, exact cause unclear. Dermatology consulted, biopsy performed, results pending. - Contact dermatology for biopsy results and further guidance  - Contacted dermatology and they would like for him  to continue steroid cream, they will get him in next week. They said the biopsy was consistent with psoriasis.  - I still have concern for severe psoriasis such as erythrodermic variant, discussed ER precautions for dehydration, overheating, or infection. - Derm appointment was set for next Friday.   Hepatic steatosis with thrombocytopenia, concern for early cirrhosis Hepatic steatosis identified on ultrasound with thrombocytopenia, raising concern for early cirrhosis. No signs of liver failure, but thrombocytopenia suggests impaired liver function. Alcohol use is a risk factor for liver disease. He has stopped alcohol consumption, crucial for liver health. - Refer to GI specialist for further evaluation and potential liver biopsy - Advise complete abstinence from alcohol  Alcohol use disorder, in remission Alcohol use disorder in remission. Reports no alcohol consumption since last visit. Abstinence is critical to prevent further liver damage and potential progression to cirrhosis. - Encourage continued abstinence from alcohol     I personally spent a total of 35 minutes in the care of the patient today including preparing to see the patient, getting/reviewing separately obtained history, performing a medically appropriate exam/evaluation, counseling and educating, placing orders, and documenting clinical information in the EHR.  Return in about 2 weeks (around 12/17/2023).    Brennden Masten T Akosua Constantine, PA-C

## 2023-12-07 ENCOUNTER — Encounter (HOSPITAL_BASED_OUTPATIENT_CLINIC_OR_DEPARTMENT_OTHER): Payer: Self-pay

## 2023-12-16 ENCOUNTER — Ambulatory Visit (INDEPENDENT_AMBULATORY_CARE_PROVIDER_SITE_OTHER): Admitting: Student

## 2023-12-16 ENCOUNTER — Encounter (HOSPITAL_BASED_OUTPATIENT_CLINIC_OR_DEPARTMENT_OTHER): Payer: Self-pay | Admitting: Student

## 2023-12-16 VITALS — BP 160/87 | HR 58 | Temp 97.7°F | Resp 16 | Ht 67.0 in | Wt 222.5 lb

## 2023-12-16 DIAGNOSIS — R030 Elevated blood-pressure reading, without diagnosis of hypertension: Secondary | ICD-10-CM

## 2023-12-16 DIAGNOSIS — Z23 Encounter for immunization: Secondary | ICD-10-CM

## 2023-12-16 DIAGNOSIS — F1021 Alcohol dependence, in remission: Secondary | ICD-10-CM

## 2023-12-16 DIAGNOSIS — R600 Localized edema: Secondary | ICD-10-CM

## 2023-12-16 DIAGNOSIS — K709 Alcoholic liver disease, unspecified: Secondary | ICD-10-CM

## 2023-12-16 DIAGNOSIS — Z9189 Other specified personal risk factors, not elsewhere classified: Secondary | ICD-10-CM

## 2023-12-16 DIAGNOSIS — L409 Psoriasis, unspecified: Secondary | ICD-10-CM

## 2023-12-16 HISTORY — DX: Psoriasis, unspecified: L40.9

## 2023-12-16 HISTORY — DX: Alcohol dependence, in remission: F10.21

## 2023-12-16 HISTORY — DX: Localized edema: R60.0

## 2023-12-16 MED ORDER — FUROSEMIDE 20 MG PO TABS: 20.0000 mg | ORAL_TABLET | Freq: Every day | ORAL | 0 refills | Status: DC

## 2023-12-16 NOTE — Progress Notes (Signed)
 Established Patient Office Visit  Subjective   Patient ID: Jeffery Browning, male    DOB: [birth year]  Age: 73 y.o. MRN: [id number]  Chief Complaint  Patient presents with   Medical Management of Chronic Issues    Follow up. Everything is feeling but feeling better. Still having some knee and leg swelling.     HPI  Discussed the use of AI scribe software for clinical note transcription with the patient, who gave verbal consent to proceed.  History of Present Illness   Jeffery Browning is a 73 year old male who presents with severe full body psoriasis and pedal edema bilaterally.  He experienced severe skin burning and swelling, which improved significantly after visiting a dermatologist last Friday. He received two injections which alleviated the pain by Friday afternoon. His feet, hands, and legs were swollen, but the swelling has been gradually decreasing. His hands are starting to return to normal, although the skin on his feet is peeling, requiring lotion to prevent cracking and tenderness.  He was prescribed prednisone , taking two pills a day, and is awaiting approval for [employer]. Blood tests were conducted to assess his suitability for [employer]. He fell before going to the hospital last Friday, which coincided with a worsening of his skin condition, described as 'my skin just got on fire.'  No chest pain or shortness of breath. He has not consumed alcohol recently, which is relevant given his history of elevated liver enzymes. No recent cardiovascular screenings or exams, such as coronary calcium or echocardiograms, have been conducted.     Patient Active Problem List   Diagnosis Date Noted   Confusion 11/25/2023   Rash 11/25/2023   Alcohol abuse, continuous 11/25/2023   Hepatic steatosis 11/25/2023   Positive ANA (antinuclear antibody) 06/05/2016   Multiple joint pain 06/05/2016   History reviewed. No pertinent past medical history. Social History   Tobacco Use    Smoking status: Some Days    Types: Cigars    Passive exposure: Past   Smokeless tobacco: Never  Vaping Use   Vaping status: Never Used  Substance Use Topics   Alcohol use: Not Currently    Alcohol/week: 12.0 standard drinks of alcohol    Types: 12 Cans of beer per week    Comment: QUIT DRINKING 11/19/2023. 12 beers and a couple pints of vodka weekly sometimes more or less   Drug use: Never   No Known Allergies    ROS Per HPI.    Objective:     BP (!) 160/87   Pulse (!) 58   Temp 97.7 F (36.5 C) (Oral)   Resp 16   Ht 5' 7 (1.702 m)   Wt 222 lb 8 oz (100.9 kg)   SpO2 96%   BMI 34.85 kg/m  BP Readings from Last 3 Encounters:  12/16/23 (!) 160/87  12/03/23 (!) 156/91  11/25/23 (!) 151/85   Wt Readings from Last 3 Encounters:  12/16/23 222 lb 8 oz (100.9 kg)  12/03/23 216 lb 3.2 oz (98.1 kg)  11/25/23 210 lb 6.4 oz (95.4 kg)      Physical Exam Constitutional:      General: He is not in acute distress.    Appearance: Normal appearance. He is not ill-appearing.  HENT:     Head: Normocephalic and atraumatic.     Right Ear: External ear normal.     Left Ear: External ear normal.     Nose: Nose normal.  Eyes:     Conjunctiva/sclera: Conjunctivae  normal.  Cardiovascular:     Rate and Rhythm: Normal rate and regular rhythm.     Pulses: Normal pulses.     Heart sounds: Normal heart sounds. No murmur heard.    No friction rub.  Pulmonary:     Effort: Pulmonary effort is normal. No respiratory distress.     Breath sounds: Normal breath sounds. No wheezing, rhonchi or rales.  Skin:    General: Skin is warm and dry.     Coloration: Skin is not jaundiced or pale.     Comments: Skin still peeling, appears much better. 3+ pitting edema on his legs, no weeping.  Neurological:     Mental Status: He is alert.  Psychiatric:        Mood and Affect: Mood normal.        Behavior: Behavior normal.      No results found for any visits on 12/16/23.  Last CBC Lab  Results  Component Value Date   WBC 7.9 11/25/2023   HGB 13.8 11/25/2023   HCT 40.6 11/25/2023   MCV 94 11/25/2023   MCH 31.8 11/25/2023   RDW 13.0 11/25/2023   PLT 104 (L) 11/25/2023   Last metabolic panel Lab Results  Component Value Date   GLUCOSE 62 (L) 11/21/2023   NA 140 11/21/2023   K 4.4 11/21/2023   CL 92 (L) 11/21/2023   CO2 18 (L) 11/21/2023   BUN 14 11/21/2023   CREATININE 0.94 11/21/2023   EGFR 87 11/21/2023   CALCIUM 9.1 11/21/2023   PROT 7.2 11/21/2023   ALBUMIN 4.5 11/21/2023   LABGLOB 2.7 11/21/2023   BILITOT 0.5 11/21/2023   ALKPHOS 59 11/21/2023   AST 141 (H) 11/21/2023   ALT 89 (H) 11/21/2023   ANIONGAP 10 05/16/2019   Last lipids No results found for: CHOL, HDL, LDLCALC, LDLDIRECT, TRIG, CHOLHDL Last hemoglobin A1c No results found for: HGBA1C Last thyroid functions Lab Results  Component Value Date   TSH 0.840 11/21/2023   Last vitamin B12 and Folate Lab Results  Component Value Date   VITAMINB12 >2000 (H) 11/25/2023   FOLATE >20.0 11/25/2023      The ASCVD Risk score (Arnett DK, et al., 2019) failed to calculate for the following reasons:   Cannot find a previous HDL lab   Cannot find a previous total cholesterol lab    Assessment & Plan:   Assessment and Plan    Sever Psoriasis Significant improvement after two injections and prednisone . Swelling in hands, feet, and legs is reducing, and skin is peeling. Dermatologist considering Skyrizi pending blood test results. - Continue prednisone  as prescribed - Keep skin moisturized to prevent cracking - Avoid compression stockings - Consider Uniboot if necessary - Follow up with dermatologist on October 2nd - Check blood test results with dermatologist - Consider starting Skyrizi pending blood test results  Edema Significant swelling in feet and legs, likely exacerbated by steroid use. 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 No alcohol consumption reported but previously . - Follow up with liver specialist- already referred - Continue to abstain from alcohol - Monitor liver function tests  Elevated BP without dx of hypertension Blood pressure elevated, possibly due to not eating and coffee  consumption. - Monitor blood pressure twice daily for two weeks - Avoid coffee before clinic visits - Follow up in six weeks for blood pressure evaluation  General Health Maintenance Due for tetanus, pneumonia, and shingles vaccinations. Importance of vaccinations discussed, especially shingles vaccine due to previous mild case and potential Skyrizi treatment. - Administer tetanus vaccine today - Schedule shingles vaccine on a Friday for recovery - Plan for pneumonia and other vaccinations over time      I personally spent a total of 30 minutes in the care of the patient today including preparing to see the patient, getting/reviewing separately obtained history, performing a medically appropriate exam/evaluation, counseling and educating, placing orders, and documenting clinical information in the EHR.   Return in about 6 weeks (around 01/27/2024) for elevated BP.    Neysa Arts T Mats Jeanlouis, PA-C

## 2023-12-16 NOTE — Patient Instructions (Signed)
 It was nice to see you today!  If you have any problems before your next visit feel free to message me via MyChart (minor issues or questions) or call the office, otherwise you may reach out to schedule an office visit.  Thank you! Pau Banh, PA-C

## 2023-12-17 ENCOUNTER — Inpatient Hospital Stay (HOSPITAL_BASED_OUTPATIENT_CLINIC_OR_DEPARTMENT_OTHER)
Admission: RE | Admit: 2023-12-17 | Discharge: 2023-12-17 | Discharge status: Home / Self Care | Payer: Self-pay | Source: Ambulatory Visit | Attending: Student | Admitting: Radiology

## 2023-12-17 DIAGNOSIS — Z9189 Other specified personal risk factors, not elsewhere classified: Secondary | ICD-10-CM

## 2023-12-20 ENCOUNTER — Ambulatory Visit (HOSPITAL_BASED_OUTPATIENT_CLINIC_OR_DEPARTMENT_OTHER): Payer: Self-pay | Admitting: Student

## 2023-12-20 DIAGNOSIS — I7121 Aneurysm of the ascending aorta, without rupture: Secondary | ICD-10-CM

## 2023-12-20 DIAGNOSIS — R931 Abnormal findings on diagnostic imaging of heart and coronary circulation: Secondary | ICD-10-CM

## 2023-12-23 ENCOUNTER — Other Ambulatory Visit (HOSPITAL_BASED_OUTPATIENT_CLINIC_OR_DEPARTMENT_OTHER): Payer: Self-pay | Admitting: Student

## 2023-12-23 DIAGNOSIS — R600 Localized edema: Secondary | ICD-10-CM

## 2023-12-30 ENCOUNTER — Ambulatory Visit (HOSPITAL_BASED_OUTPATIENT_CLINIC_OR_DEPARTMENT_OTHER)

## 2023-12-30 ENCOUNTER — Other Ambulatory Visit (HOSPITAL_BASED_OUTPATIENT_CLINIC_OR_DEPARTMENT_OTHER): Payer: Self-pay | Admitting: Student

## 2023-12-30 DIAGNOSIS — Z13220 Encounter for screening for lipoid disorders: Secondary | ICD-10-CM

## 2023-12-30 DIAGNOSIS — I7121 Aneurysm of the ascending aorta, without rupture: Secondary | ICD-10-CM

## 2023-12-30 DIAGNOSIS — R931 Abnormal findings on diagnostic imaging of heart and coronary circulation: Secondary | ICD-10-CM

## 2023-12-30 HISTORY — DX: Aneurysm of the ascending aorta, without rupture: I71.21

## 2023-12-30 HISTORY — DX: Abnormal findings on diagnostic imaging of heart and coronary circulation: R93.1

## 2023-12-31 ENCOUNTER — Ambulatory Visit (INDEPENDENT_AMBULATORY_CARE_PROVIDER_SITE_OTHER): Admitting: *Deleted

## 2023-12-31 DIAGNOSIS — R931 Abnormal findings on diagnostic imaging of heart and coronary circulation: Secondary | ICD-10-CM

## 2023-12-31 DIAGNOSIS — Z23 Encounter for immunization: Secondary | ICD-10-CM

## 2023-12-31 DIAGNOSIS — Z13220 Encounter for screening for lipoid disorders: Secondary | ICD-10-CM

## 2023-12-31 NOTE — Progress Notes (Signed)
 Patient is in office today for a nurse visit for Immunization. Patient Injection was given in the  Right deltoid. Patient tolerated injection well.

## 2024-01-01 LAB — LIPID PANEL
Chol/HDL Ratio: 4.4 ratio (ref 0.0–5.0)
Cholesterol, Total: 184 mg/dL (ref 100–199)
HDL: 42 mg/dL (ref >39)
LDL Chol Calc (NIH): 121 mg/dL — ABNORMAL HIGH (ref 0–99)
Triglycerides: 116 mg/dL (ref 0–149)
VLDL Cholesterol Cal: 21 mg/dL (ref 5–40)

## 2024-01-03 ENCOUNTER — Other Ambulatory Visit (HOSPITAL_BASED_OUTPATIENT_CLINIC_OR_DEPARTMENT_OTHER): Payer: Self-pay | Admitting: Student

## 2024-01-03 ENCOUNTER — Ambulatory Visit (HOSPITAL_BASED_OUTPATIENT_CLINIC_OR_DEPARTMENT_OTHER): Payer: Self-pay | Admitting: Student

## 2024-01-03 DIAGNOSIS — I7121 Aneurysm of the ascending aorta, without rupture: Secondary | ICD-10-CM

## 2024-01-03 DIAGNOSIS — R931 Abnormal findings on diagnostic imaging of heart and coronary circulation: Secondary | ICD-10-CM

## 2024-01-03 MED ORDER — ROSUVASTATIN CALCIUM 20 MG PO TABS
20.0000 mg | ORAL_TABLET | Freq: Every day | ORAL | 3 refills | Status: AC
Start: 2024-01-03 — End: ?

## 2024-01-04 ENCOUNTER — Other Ambulatory Visit (HOSPITAL_BASED_OUTPATIENT_CLINIC_OR_DEPARTMENT_OTHER): Payer: Self-pay | Admitting: Student

## 2024-01-04 ENCOUNTER — Telehealth (HOSPITAL_BASED_OUTPATIENT_CLINIC_OR_DEPARTMENT_OTHER): Payer: Self-pay

## 2024-01-04 DIAGNOSIS — I7121 Aneurysm of the ascending aorta, without rupture: Secondary | ICD-10-CM

## 2024-01-04 NOTE — Telephone Encounter (Signed)
 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 As long as he is okay with that, I will send in the CTA. Let me know.       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 /The CT cardiac does not give an accurate size of Aneurysm. Once the scan is completed I will get him scheduled. Thanks for your help

## 2024-01-06 ENCOUNTER — Other Ambulatory Visit: Payer: Self-pay

## 2024-01-06 NOTE — Progress Notes (Unsigned)
 Cardiology Office Note:    Date:  01/07/2024   ID:  Jeffery Browning, DOB [birth year], MRN [id number]  PCP:  Jeffery [name], PA-C  Cardiologist:  Jeffery Leiter, MD   Referring MD: Jeffery [name], PA-C  ASSESSMENT:    1. Agatston coronary artery calcium score greater than 400   2. Hepatic steatosis   3. Mixed hyperlipidemia   4. Edema of both lower extremities   5. Ascending aorta enlargement   6. Ex-smoker    PLAN:    In order of problems listed above:  This is a complex case of a man at increased cardiovascular risk with his rheumatologic disorder psoriasis and arthritis hyperlipidemia and cardiometabolic disease with hepatic steatosis.  In view of his exercise intolerance EKG abnormality and lower extremity edema I think he should undergo further cardiac evaluation including echocardiogram for left or right ventricular dysfunction and a cardiac CTA he is at high risk of significant CAD.  Both the patient and his wife are in agreement.  Cardiac CTA will be scheduled.  He also start taking low-dose aspirin with CAD equivalent calcium score Continue his current high intensity statin month will check labs including ApoB and LP(a) and his call in case of goal LDL I think should be less than 55-60 He will need a follow-up CT of the chest in 1 year Approximately 25% of people with enlargement of thoracic aorta have abdominal aortic aneurysm he is a previous smoker annual of the vascular screen performed to look at his abdominal aorta and screen for carotid disease and lower extremity PAD with his diminished exercise tolerance I told him once he gets through these types of studies I think he should see a rheumatologist I think he has more than skin psoriasis and would benefit comprehensive evaluation.  Next appointment 6 to 8-week follow-up to review studies   Medication Adjustments/Labs and Tests Ordered: Current medicines are reviewed at length with the patient today.  Concerns  regarding medicines are outlined above.  Orders Placed This Encounter  Procedures   EKG 12-Lead   No orders of the defined types were placed in this encounter.    No chief complaint on file.   History of Present Illness:    Jeffery Browning is a 73 y.o. male who is being seen today for the evaluation of chronic calcium score at the request of Jeffery Browning, [name], PA-C.  He had a chest CT in 12/29/2023 with ascending thoracic aorta just shy of aneurysm at 4.4 cm.  He also had findings of hepatic steatosis.  This study was for coronary calcium score which was quite high at 771 79th percentile.  The coronary calcification was predominantly localized to the left anterior descending coronary artery.  He has a lifelong history of cirrhotic status associated with arthritis and had a recent flare and with steroid therapy developed worsened lower extremity edema He has no known history of heart disease congenital rheumatic or atrial fibrillation He is limited in his physical activities and he tells me he just cannot keep up with his wife some of this is related to arthritis however his exercise ability is diminished although he is not having typical angina shortness of breath orthopnea palpitation or syncope He is also noted to have a thoracic aortic aneurysm which can be associated with rheumatologic disorder Although he was a smoker he has never been screened for abdominal aorta aneurysm  Past Medical History:  Diagnosis Date   Agatston coronary artery calcium score greater than 400-  score 771 12/30/2023   Alcohol abuse, continuous 11/25/2023   Alcohol use disorder, moderate, in early remission (HCC) 12/16/2023   Aneurysm of ascending aorta without rupture 12/30/2023   Confusion 11/25/2023   Hepatic steatosis 11/25/2023   Multiple joint pain 06/05/2016   Pedal edema 12/16/2023   Positive ANA (antinuclear antibody) 06/05/2016   Psoriasis 12/16/2023   Rash 11/25/2023    Past Surgical  History:  Procedure Laterality Date   APPENDECTOMY      Current Medications: Current Meds  Medication Sig   furosemide  (LASIX ) 20 MG tablet Take 1 tablet (20 mg total) by mouth daily.   rosuvastatin (CRESTOR) 20 MG tablet Take 1 tablet (20 mg total) by mouth daily.   triamcinolone ointment (KENALOG) 0.1 % Apply 1 Application topically daily.     Allergies:   Patient has no known allergies.   Social History   Socioeconomic History   Marital status: Married    Spouse name: Not on file   Number of children: 2   Years of education: Not on file   Highest education level: Not on file  Occupational History   Not on file  Tobacco Use   Smoking status: Some Days    Types: Cigars    Passive exposure: Past   Smokeless tobacco: Never  Vaping Use   Vaping status: Never Used  Substance and Sexual Activity   Alcohol use: Not Currently    Alcohol/week: 12.0 standard drinks of alcohol    Types: 12 Cans of beer per week    Comment: QUIT DRINKING 11/19/2023. 12 beers and a couple pints of vodka weekly sometimes more or less   Drug use: Never   Sexual activity: Not on file  Other Topics Concern   Not on file  Social History Narrative   Not on file   Social Drivers of Health   Financial Resource Strain: Not on file  Food Insecurity: No Food Insecurity (11/25/2023)   Hunger Vital Sign    Worried About Running Out of Food in the Last Year: Never true    Ran Out of Food in the Last Year: Never true  Transportation Needs: No Transportation Needs (11/25/2023)   PRAPARE - Administrator, Civil Service (Medical): No    Lack of Transportation (Non-Medical): No  Physical Activity: Not on file  Stress: Not on file  Social Connections: Not on file     Family History: The patient's family history includes Healthy in his mother; Heart attack in his father.  ROS:   ROS Please see the history of present illness.     All other systems reviewed and are negative.  EKGs/Labs/Other  Studies Reviewed:    The following studies were reviewed today:   Cardiac Studies & Procedures   ______________________________________________________________________________________________          CT SCANS  CT CARDIAC SCORING (SELF PAY ONLY) 12/17/2023  Addendum 12/29/2023  5:32 PM ADDENDUM REPORT: 12/29/2023 17:29  EXAM: OVER-READ INTERPRETATION  CT CHEST  The following report is an over-read performed by radiologist Dr. Andrea Gasman of [facility] on 12/29/2023. This over-read does not include interpretation of cardiac or coronary anatomy or pathology. The coronary calcium score interpretation by the cardiologist is attached.  COMPARISON:  None.  FINDINGS: Vascular: Aortic atherosclerosis. The ascending aorta is dilated to 4.4 cm. The descending aorta is tortuous.  Mediastinum/nodes: There are multiple small lymph nodes adjacent to the distal esophagus. Equivocal and distal esophageal wall thickening. Unremarkable esophagus.  Lungs: Subpleural  scarring in the medial right lower lobe adjacent to thoracic spine osteophytes. Subsegmental atelectasis in the lower lobes. No focal airspace disease. No pulmonary nodule. No pleural fluid. The included airways are patent.  Upper abdomen: No acute findings. Mild diffuse hepatic steatosis. Water density lesions in the liver typical of cysts.  Musculoskeletal: There are no acute or suspicious osseous abnormalities. Thoracic spondylosis.  IMPRESSION: 1. Ascending aortic aneurysm at 4.4 cm. Recommend annual imaging followup by CTA or MRA. This recommendation follows 2010 ACCF/AHA/AATS/ACR/ASA/SCA/SCAI/SIR/STS/SVM Guidelines for the Diagnosis and Management of Patients with Thoracic Aortic Disease. Circulation. 2010; 121: Z733-z630. Aortic aneurysm NOS (ICD10-I71.9) 2. Equivocal distal esophageal wall thickening with multiple small lymph nodes adjacent to the distal esophagus. Recommend correlation for  symptoms of esophagitis/reflux. Consider endoscopy. 3. Hepatic steatosis.  Aortic Atherosclerosis (ICD10-I70.0).   Electronically Signed By: Andrea Gasman M.D. On: 12/29/2023 17:29  Narrative : CLINICAL DATA:  Cardiovascular Disease Risk stratification  EXAM:  Coronary Calcium Score  TECHNIQUE:  A gated, non-contrast computed tomography scan of the heart was  performed using 3mm slice thickness. Axial images were analyzed on a  dedicated workstation. Calcium scoring of the coronary arteries was  performed using the Agatston method.  FINDINGS:  Coronary Calcium Score:  Left main: 0  Left anterior descending artery: 575  Left circumflex artery: 141  Right coronary artery: 54.4  Total: 771  Pericardium: Normal.  Ascending Aorta: Normal caliber.  Aortic valve - mild calcifications noted.  Pulmonary artery: Normal caliber  Non-cardiac: See separate report from [facility].  IMPRESSION:  Coronary calcium score of 771. This was 66 percentile for age-, race-,  and sex-matched controls.  RECOMMENDATIONS:  Coronary artery calcium (CAC) score is a strong predictor of  incident coronary heart disease (CHD) and provides predictive  information beyond traditional risk factors. CAC scoring is  reasonable to use in the decision to withhold, postpone, or initiate  statin therapy in intermediate-risk or selected borderline-risk  asymptomatic adults (age 38-75 years and LDL-C >=70 to <190 mg/dL)  who do not have diabetes or established atherosclerotic  cardiovascular disease (ASCVD).* In intermediate-risk (10-year ASCVD  risk >=7.5% to <20%) adults or selected borderline-risk (10-year  ASCVD risk >=5% to <7.5%) adults in whom a CAC score is measured for  the purpose of making a treatment decision the following  recommendations have been made:  If CAC=0, it is reasonable to withhold statin therapy and reassess  in 5 to 10 years, as long  as higher risk conditions are absent  (diabetes mellitus, family history of premature CHD in first degree  relatives (males <55 years; females <65 years), cigarette smoking,  or LDL >=190 mg/dL).  If CAC is 1 to 99, it is reasonable to initiate statin therapy for  patients >=9 years of age.  If CAC is >=100 or >=75th percentile, it is reasonable to initiate  statin therapy at any age.  Cardiology referral should be considered for patients with CAC  scores >=400 or >=75th percentile.  *2018 AHA/ACC/AACVPR/AAPA/ABC/ACPM/ADA/AGS/APhA/ASPC/NLA/PCNA  Guideline on the Management of Blood Cholesterol: A Report of the  American College of Cardiology/American Heart Association Task Force  on Clinical Practice Guidelines. J Am Coll Cardiol.  2019;73(24):3168-3209.  Electronically Signed: By: Lamar Fitch M.D. On: 12/17/2023 17:39     ______________________________________________________________________________________________      EKG Interpretation Date/Time:  Friday January 07 2024 10:49:48 EDT Ventricular Rate:  85 PR Interval:  178 QRS Duration:  84 QT Interval:  386 QTC Calculation: 459 R Axis:  13  Text Interpretation: Normal sinus rhythm Inferior T inversion When compared with ECG of 16-May-2019 21:12, unchanged Confirmed by Monetta Rogue ([id number]) on 01/07/2024 10:55:34 AM  Although the initial computer over read said inferior infarction it does not fulfill criteria but he has T wave inversion noted in the inferior leads Recent Labs: 11/21/2023: ALT 89; BUN 14; Creatinine, Ser 0.94; Potassium 4.4; Sodium 140; TSH 0.840 11/25/2023: Hemoglobin 13.8; Platelets 104  Recent Lipid Panel    Component Value Date/Time   CHOL 184 12/31/2023 0838   TRIG 116 12/31/2023 0838   HDL 42 12/31/2023 0838   CHOLHDL 4.4 12/31/2023 0838   LDLCALC 121 (H) 12/31/2023 0838    Physical Exam:    VS:  BP 130/88   Pulse 85   Ht 5' 10 (1.778 m)   Wt 207 lb 6.4 oz (94.1 kg)    SpO2 96%   BMI 29.76 kg/m     Wt Readings from Last 3 Encounters:  01/07/24 207 lb 6.4 oz (94.1 kg)  12/16/23 222 lb 8 oz (100.9 kg)  12/03/23 216 lb 3.2 oz (98.1 kg)     GEN:  Well nourished, well developed in no acute distress HEENT: Normal NECK: No JVD; No carotid bruits LYMPHATICS: No lymphadenopathy CARDIAC: RRR, no murmurs, rubs, gallops RESPIRATORY:  Clear to auscultation without rales, wheezing or rhonchi  ABDOMEN: Soft, non-tender, non-distended MUSCULOSKELETAL:  No edema; No deformity  SKIN: Warm and dry NEUROLOGIC:  Alert and oriented x 3 PSYCHIATRIC:  Normal affect     Signed, Rogue Monetta, MD  01/07/2024 11:28 AM    [facility]

## 2024-01-07 ENCOUNTER — Ambulatory Visit: Attending: Cardiology | Admitting: Cardiology

## 2024-01-07 ENCOUNTER — Encounter: Payer: Self-pay | Admitting: Cardiology

## 2024-01-07 VITALS — BP 130/88 | HR 85 | Ht 70.0 in | Wt 207.4 lb

## 2024-01-07 DIAGNOSIS — E782 Mixed hyperlipidemia: Secondary | ICD-10-CM

## 2024-01-07 DIAGNOSIS — R600 Localized edema: Secondary | ICD-10-CM

## 2024-01-07 DIAGNOSIS — K760 Fatty (change of) liver, not elsewhere classified: Secondary | ICD-10-CM | POA: Diagnosis not present

## 2024-01-07 DIAGNOSIS — R931 Abnormal findings on diagnostic imaging of heart and coronary circulation: Secondary | ICD-10-CM | POA: Diagnosis not present

## 2024-01-07 DIAGNOSIS — Z87891 Personal history of nicotine dependence: Secondary | ICD-10-CM

## 2024-01-07 DIAGNOSIS — I7789 Other specified disorders of arteries and arterioles: Secondary | ICD-10-CM

## 2024-01-07 MED ORDER — ASPIRIN 81 MG PO TBEC: 81.0000 mg | DELAYED_RELEASE_TABLET | Freq: Every day | ORAL | 3 refills | Status: AC

## 2024-01-07 MED ORDER — METOPROLOL TARTRATE 100 MG PO TABS
100.0000 mg | ORAL_TABLET | Freq: Once | ORAL | 0 refills | Status: DC
Start: 2024-01-07 — End: 2024-02-07

## 2024-01-07 NOTE — Patient Instructions (Signed)
 Medication Instructions:  Your physician has recommended you make the following change in your medication:   START: Aspirin 81 mg daily  *If you need a refill on your cardiac medications before your next appointment, please call your pharmacy*  Lab Work: Your physician recommends that you return for lab work in:   Labs today: CMP, Lipids, Apo B, Lpa  If you have labs (blood work) drawn today and your tests are completely normal, you will receive your results only by: MyChart Message (if you have MyChart) OR A paper copy in the mail If you have any lab test that is abnormal or we need to change your treatment, we will call you to review the results.  Testing/Procedures: Your physician has requested that you have an echocardiogram. Echocardiography is a painless test that uses sound waves to create images of your heart. It provides your doctor with information about the size and shape of your heart and how well your heart's chambers and valves are working. This procedure takes approximately one hour. There are no restrictions for this procedure. Please do NOT wear cologne, perfume, aftershave, or lotions (deodorant is allowed). Please arrive 15 minutes prior to your appointment time.  Please note: We ask at that you not bring children with you during ultrasound (echo/ vascular) testing. Due to room size and safety concerns, children are not allowed in the ultrasound rooms during exams. Our front office staff cannot provide observation of children in our lobby area while testing is being conducted. An adult accompanying a patient to their appointment will only be allowed in the ultrasound room at the discretion of the ultrasound technician under special circumstances. We apologize for any inconvenience.  [employer]    Your cardiac CT will be scheduled at one of the below locations:   [facility] [address] [place], [place] [address] [phone] (Severe contrast  allergies only)  OR   [facility] [address] [place], [place] [address] [phone]  OR   [facility] [address] [place], [place] [address] [phone]  OR   [facility] and Vascular Tower [address]  [place], [place] [address] [phone]  OR   [facility] [address] [place], [place] [phone]  If scheduled at [facility], please arrive at the [facility] and Children's Entrance (Entrance C2) of [facility] 30 minutes prior to test start time. You can use the FREE valet parking offered at entrance C (encouraged to control the heart rate for the test)  Proceed to the [facility] Radiology Department (first floor) to check-in and test prep.  All radiology patients and guests should use entrance C2 at [facility], accessed from [facility], even though the hospital's physical address listed is [address]  If scheduled at the Heart and Vascular Tower at [address], please enter the parking lot using the [address] entrance and use the FREE valet service at the patient drop-off area. Enter the building and check-in with registration on the main floor.  If scheduled at [facility], please arrive to the Heart and Vascular Center 15 mins early for check-in and test prep.  There is spacious parking and easy access to the radiology department from the [facility] Heart and Vascular entrance. Please enter here and check-in with the desk attendant.   If scheduled at [facility], please arrive 30 minutes early for check-in and test prep.  Please follow these instructions carefully (unless otherwise directed):  An IV will be required for this test and Nitroglycerin will be given.  Hold all erectile dysfunction medications at least 3 days (72 hrs) prior to test. (Ie viagra, cialis, sildenafil, tadalafil, etc)   On the Night  Before the Test: Be sure to Drink plenty of water. Do not consume any caffeinated/decaffeinated beverages or chocolate 12 hours prior to your test. Do not take any antihistamines 12 hours prior to your test.  On the Day of the Test: Drink plenty of water until 1 hour prior to the test. Do not eat any food 1 hour prior to test. You may take your regular medications prior to the test.  Take metoprolol (Lopressor) two hours prior to test. If you take Furosemide  please HOLD on the morning of the test. Patients who wear a continuous glucose monitor MUST remove the device prior to scanning.      After the Test: Drink plenty of water. After receiving IV contrast, you may experience a mild flushed feeling. This is normal. On occasion, you may experience a mild rash up to 24 hours after the test. This is not dangerous. If this occurs, you can take Benadryl 25 mg, Zyrtec, Claritin, or Allegra and increase your fluid intake. (Patients taking Tikosyn should avoid Benadryl, and may take Zyrtec, Claritin, or Allegra) If you experience trouble breathing, this can be serious. If it is severe call 911 IMMEDIATELY. If it is mild, please call our office.  We will call to schedule your test 2-4 weeks out understanding that some insurance companies will need an authorization prior to the service being performed.   For more information and frequently asked questions, please visit our website : [email or web address]  For non-scheduling related questions, please contact the cardiac imaging nurse navigator should you have any questions/concerns: Cardiac Imaging Nurse Navigators Direct Office Dial: [phone]   For scheduling needs, including cancellations and rescheduling, please call [place], [phone].   Follow-Up: At [facility], you and your health needs are our priority.  As part of our continuing mission to provide you with exceptional heart care, our providers are all part of  one team.  This team includes your primary Cardiologist (physician) and Advanced Practice Providers or APPs (Physician Assistants and Nurse Practitioners) who all work together to provide you with the care you need, when you need it.  Your next appointment:   8 week(s)  Provider:   [name], MD    We recommend signing up for the patient portal called MyChart.  Sign up information is provided on this After Visit Summary.  MyChart is used to connect with patients for Virtual Visits (Telemedicine).  Patients are able to view lab/test results, encounter notes, upcoming appointments, etc.  Non-urgent messages can be sent to your provider as well.   To learn more about what you can do with MyChart, go to [email or web address].   Other Instructions None

## 2024-01-09 LAB — COMPREHENSIVE METABOLIC PANEL WITH GFR
ALT: 26 IU/L (ref 0–44)
AST: 27 IU/L (ref 0–40)
Albumin: 4.2 g/dL (ref 3.8–4.8)
Alkaline Phosphatase: 69 IU/L (ref 47–123)
BUN/Creatinine Ratio: 13 (ref 10–24)
BUN: 11 mg/dL (ref 8–27)
Bilirubin Total: 0.5 mg/dL (ref 0.0–1.2)
CO2: 26 mmol/L (ref 20–29)
Calcium: 9.6 mg/dL (ref 8.6–10.2)
Chloride: 95 mmol/L — ABNORMAL LOW (ref 96–106)
Creatinine, Ser: 0.83 mg/dL (ref 0.76–1.27)
Globulin, Total: 2.7 g/dL (ref 1.5–4.5)
Glucose: 92 mg/dL (ref 70–99)
Potassium: 3.7 mmol/L (ref 3.5–5.2)
Sodium: 137 mmol/L (ref 134–144)
Total Protein: 6.9 g/dL (ref 6.0–8.5)
eGFR: 94 mL/min/1.73 (ref >59)

## 2024-01-09 LAB — LIPID PANEL
Chol/HDL Ratio: 4.2 ratio (ref 0.0–5.0)
Cholesterol, Total: 187 mg/dL (ref 100–199)
HDL: 45 mg/dL (ref >39)
LDL Chol Calc (NIH): 92 mg/dL (ref 0–99)
Triglycerides: 303 mg/dL — ABNORMAL HIGH (ref 0–149)
VLDL Cholesterol Cal: 50 mg/dL — ABNORMAL HIGH (ref 5–40)

## 2024-01-09 LAB — LIPOPROTEIN A (LPA): Lipoprotein (a): 387.8 nmol/L — ABNORMAL HIGH (ref <75.0)

## 2024-01-09 LAB — APOLIPOPROTEIN B: Apolipoprotein B: 102 mg/dL — ABNORMAL HIGH (ref <90)

## 2024-01-12 ENCOUNTER — Ambulatory Visit: Attending: Cardiology

## 2024-01-12 ENCOUNTER — Ambulatory Visit: Payer: Self-pay | Admitting: Cardiology

## 2024-01-12 DIAGNOSIS — E782 Mixed hyperlipidemia: Secondary | ICD-10-CM

## 2024-01-12 DIAGNOSIS — K760 Fatty (change of) liver, not elsewhere classified: Secondary | ICD-10-CM

## 2024-01-12 DIAGNOSIS — I7789 Other specified disorders of arteries and arterioles: Secondary | ICD-10-CM

## 2024-01-12 DIAGNOSIS — R931 Abnormal findings on diagnostic imaging of heart and coronary circulation: Secondary | ICD-10-CM

## 2024-01-12 DIAGNOSIS — R600 Localized edema: Secondary | ICD-10-CM

## 2024-01-12 DIAGNOSIS — Z87891 Personal history of nicotine dependence: Secondary | ICD-10-CM

## 2024-01-24 ENCOUNTER — Encounter (HOSPITAL_COMMUNITY): Payer: Self-pay | Admitting: Cardiology

## 2024-01-26 ENCOUNTER — Encounter (HOSPITAL_BASED_OUTPATIENT_CLINIC_OR_DEPARTMENT_OTHER): Payer: Self-pay | Admitting: Student

## 2024-01-26 ENCOUNTER — Ambulatory Visit (INDEPENDENT_AMBULATORY_CARE_PROVIDER_SITE_OTHER): Admitting: Student

## 2024-01-26 VITALS — BP 152/94 | HR 89 | Temp 97.6°F | Resp 16 | Ht 70.0 in | Wt 208.3 lb

## 2024-01-26 DIAGNOSIS — F1021 Alcohol dependence, in remission: Secondary | ICD-10-CM

## 2024-01-26 DIAGNOSIS — K760 Fatty (change of) liver, not elsewhere classified: Secondary | ICD-10-CM

## 2024-01-26 DIAGNOSIS — R030 Elevated blood-pressure reading, without diagnosis of hypertension: Secondary | ICD-10-CM

## 2024-01-26 DIAGNOSIS — L4050 Arthropathic psoriasis, unspecified: Secondary | ICD-10-CM | POA: Diagnosis not present

## 2024-01-26 DIAGNOSIS — L409 Psoriasis, unspecified: Secondary | ICD-10-CM

## 2024-01-26 DIAGNOSIS — R931 Abnormal findings on diagnostic imaging of heart and coronary circulation: Secondary | ICD-10-CM | POA: Diagnosis not present

## 2024-01-26 NOTE — Progress Notes (Unsigned)
 Established Patient Office Visit  Subjective   Patient ID: Jeffery Browning, male    DOB: 1950/11/27  Age: 73 y.o. MRN: [id number]  Chief Complaint  Patient presents with   Medical Management of Chronic Issues    Follow up     HPI  Discussed the use of AI scribe software for clinical note transcription with the patient, who gave verbal consent to proceed.  History of Present Illness   Jeffery Browning is a 73 year old male with psoriatic arthritis and elevated lipoprotein levels who presents for follow-up on his cardiovascular health and joint symptoms.  He notes slightly elevated blood pressure this morning, which he attributes to drinking coffee, an unusual habit for him.  He recently saw Dr. Monetta, who identified a minor blockage in the veins of his neck. He has been started on a daily cholesterol medication and aspirin. He is scheduled for a CT coronary morph on November 6th and an echocardiogram on November 12th. His recent labs showed elevated Apolipoprotein B and extremely elevated lipoprotein A. He is currently taking metoprolol before scans, daily aspirin, and Questran.  He has a history of psoriatic arthritis, diagnosed in 2018. He experiences joint stiffness, particularly after sitting for 30 minutes or more, which improves with movement. He occasionally takes Tylenol for joint pain, which provides relief. He does not use topical gels for his joints.  He experienced a significant psoriasis flare-up recently, for which he received Skyrizi injections and possibly a steroid. This treatment led to significant improvement, and he continues to apply gel to his hands. He reports ongoing improvement since the treatment.  He has a history of swelling in his legs and feet, which resolved with Lasix . He is scheduled to see a dermatologist in January.  He has a positive ANA in his past medical history and was previously referred to a rheumatologist for joint issues. He does not  currently consume alcohol.      Patient Active Problem List   Diagnosis Date Noted   Psoriatic arthritis (HCC) 01/26/2024   Aneurysm of ascending aorta without rupture 12/30/2023   Agatston coronary artery calcium score greater than 400- score 771 12/30/2023   Pedal edema 12/16/2023   Alcohol use disorder, moderate, in early remission (HCC) 12/16/2023   Psoriasis 12/16/2023   Confusion 11/25/2023   Rash 11/25/2023   Alcohol abuse, continuous 11/25/2023   Hepatic steatosis 11/25/2023   Positive ANA (antinuclear antibody) 06/05/2016   Multiple joint pain 06/05/2016   Past Medical History:  Diagnosis Date   Agatston coronary artery calcium score greater than 400- score 771 12/30/2023   Alcohol abuse, continuous 11/25/2023   Alcohol use disorder, moderate, in early remission (HCC) 12/16/2023   Aneurysm of ascending aorta without rupture 12/30/2023   Confusion 11/25/2023   Hepatic steatosis 11/25/2023   Multiple joint pain 06/05/2016   Pedal edema 12/16/2023   Positive ANA (antinuclear antibody) 06/05/2016   Psoriasis 12/16/2023   Rash 11/25/2023   Social History   Tobacco Use   Smoking status: Some Days    Types: Cigars    Passive exposure: Past   Smokeless tobacco: Never  Vaping Use   Vaping status: Never Used  Substance Use Topics   Alcohol use: Not Currently    Alcohol/week: 12.0 standard drinks of alcohol    Types: 12 Cans of beer per week    Comment: QUIT DRINKING 11/19/2023. 12 beers and a couple pints of vodka weekly sometimes more or less   Drug use: Never  No Known Allergies    ROS Per HPI.    Objective:     BP (!) 152/94   Pulse 89   Temp 97.6 F (36.4 C) (Oral)   Resp 16   Ht 5' 10 (1.778 m)   Wt 208 lb 4.8 oz (94.5 kg)   SpO2 96%   BMI 29.89 kg/m  BP Readings from Last 3 Encounters:  01/26/24 (!) 152/94  01/07/24 130/88  12/16/23 (!) 160/87   Wt Readings from Last 3 Encounters:  01/26/24 208 lb 4.8 oz (94.5 kg)  01/07/24 207 lb 6.4  oz (94.1 kg)  12/16/23 222 lb 8 oz (100.9 kg)   SpO2 Readings from Last 3 Encounters:  01/26/24 96%  01/07/24 96%  12/16/23 96%      Physical Exam Constitutional:      General: He is not in acute distress.    Appearance: Normal appearance. He is not ill-appearing.  HENT:     Head: Normocephalic and atraumatic.     Right Ear: External ear normal.     Left Ear: External ear normal.     Nose: Nose normal.  Eyes:     Conjunctiva/sclera: Conjunctivae normal.  Cardiovascular:     Rate and Rhythm: Normal rate and regular rhythm.     Pulses: Normal pulses.     Heart sounds: Normal heart sounds. No murmur heard.    No friction rub.  Pulmonary:     Effort: Pulmonary effort is normal. No respiratory distress.     Breath sounds: Normal breath sounds. No wheezing, rhonchi or rales.  Skin:    General: Skin is warm.     Coloration: Skin is not jaundiced or pale.     Comments: Skin appears extremely improved today  Neurological:     Mental Status: He is alert.  Psychiatric:        Mood and Affect: Mood normal.        Behavior: Behavior normal.      No results found for any visits on 01/26/24.  Last CBC Lab Results  Component Value Date   WBC 7.9 11/25/2023   HGB 13.8 11/25/2023   HCT 40.6 11/25/2023   MCV 94 11/25/2023   MCH 31.8 11/25/2023   RDW 13.0 11/25/2023   PLT 104 (L) 11/25/2023   Last metabolic panel Lab Results  Component Value Date   GLUCOSE 92 01/07/2024   NA 137 01/07/2024   K 3.7 01/07/2024   CL 95 (L) 01/07/2024   CO2 26 01/07/2024   BUN 11 01/07/2024   CREATININE 0.83 01/07/2024   EGFR 94 01/07/2024   CALCIUM 9.6 01/07/2024   PROT 6.9 01/07/2024   ALBUMIN 4.2 01/07/2024   LABGLOB 2.7 01/07/2024   BILITOT 0.5 01/07/2024   ALKPHOS 69 01/07/2024   AST 27 01/07/2024   ALT 26 01/07/2024   ANIONGAP 10 05/16/2019   Last lipids Lab Results  Component Value Date   CHOL 187 01/07/2024   HDL 45 01/07/2024   LDLCALC 92 01/07/2024   TRIG 303 (H)  01/07/2024   CHOLHDL 4.2 01/07/2024   Last hemoglobin A1c No results found for: HGBA1C    The 10-year ASCVD risk score (Arnett DK, et al., 2019) is: 29.9%    Assessment & Plan:   Assessment and Plan    Psoriatic arthritis and psoriasis Psoriatic arthritis with intermittent joint stiffness in ankles, arms, and shoulders, resolving within 30 minutes after inactivity- this was determined by rheumatology in 2018. Recent psoriasis flare improved with Skyrizi  and possible steroid injection. - Consider Voltaren gel for joint pain as needed - Manage joint symptoms with Tylenol as needed - Follow up with dermatologist in January  Elevated BP Blood pressure elevated this morning, possibly due to coffee and full bladder. Goal is <130/80 mmHg. Recent readings in low 130s/70s-80s, acceptable. - Monitor blood pressure at home  Elevated CAC  Coronary artery disease with increased coronary calcium score. CT coronary angiogram and echocardiogram scheduled for further evaluation. - Complete CT coronary angiogram on November 6 - Complete echocardiogram on November 12  Hyperlipidemia with elevated apolipoprotein B and lipoprotein(a) Elevated apolipoprotein B and extremely elevated lipoprotein(a) at 387, contributing to increased coronary calcium score. Managed with cholesterol medication and daily aspirin. - Continue current cholesterol medication and daily aspirin  Aortic root enlargement Aortic root enlargement noted, not meeting criteria for true aneurysm. Follow-up imaging planned. - Schedule follow-up CT of the chest in one year to monitor aortic root enlargement     Hepatic Steatosis with history of Alcohol Use Disorder - make sure to follow up with GI as prior referral - Continue to abstain f0rom alcohol  I personally spent a total of 26 minutes in the care of the patient today including preparing to see the patient, getting/reviewing separately obtained history, performing a medically  appropriate exam/evaluation, counseling and educating, placing orders, and documenting clinical information in the EHR.   Return in about 6 months (around 07/26/2024), or if symptoms worsen or fail to improve.    Naseem Varden T Maryhelen Lindler, PA-C

## 2024-01-26 NOTE — Patient Instructions (Addendum)
 Please call Dr. Gustav ALONSO Better office @  [facility] [address] [place] [address] [place], [place] [address] Phone: [phone]   It was nice to see you today!  If you have any problems before your next visit feel free to message me via MyChart (minor issues or questions) or call the office, otherwise you may reach out to schedule an office visit.  Thank you! Jacob [name], PA-C

## 2024-02-07 ENCOUNTER — Telehealth: Payer: Self-pay | Admitting: Cardiology

## 2024-02-07 MED ORDER — METOPROLOL TARTRATE 100 MG PO TABS: 100.0000 mg | ORAL_TABLET | Freq: Once | ORAL | 0 refills | Status: DC

## 2024-02-07 NOTE — Telephone Encounter (Signed)
 Pt c/o medication issue:  1. Name of Medication:   metoprolol tartrate (LOPRESSOR) 100 MG tablet (Expired)    2. How are you currently taking this medication (dosage and times per day)?  Take 1 tablet (100 mg total) by mouth once for 1 dose. Please take this medication 2 hours before CTA.       3. Are you having a reaction (difficulty breathing--STAT)? No  4. What is your medication issue? Pt's spouse stated he needs this medication again for a procedure he has coming up on Thursday. Please advise

## 2024-02-07 NOTE — Telephone Encounter (Signed)
 Advised that RX has been sent.

## 2024-02-08 ENCOUNTER — Encounter (HOSPITAL_COMMUNITY): Payer: Self-pay

## 2024-02-09 ENCOUNTER — Telehealth (HOSPITAL_COMMUNITY): Payer: Self-pay | Admitting: *Deleted

## 2024-02-09 NOTE — Telephone Encounter (Signed)
 Attempted to call patient regarding upcoming cardiac CT appointment. Left message on voicemail with name and callback number Sid Seats RN Navigator Cardiac Imaging [facility] [phone] Office

## 2024-02-10 ENCOUNTER — Ambulatory Visit (INDEPENDENT_AMBULATORY_CARE_PROVIDER_SITE_OTHER)
Admission: RE | Admit: 2024-02-10 | Discharge: 2024-02-10 | Disposition: A | Discharge status: Home / Self Care | Source: Ambulatory Visit | Attending: Cardiology | Admitting: Cardiology

## 2024-02-10 ENCOUNTER — Encounter (HOSPITAL_BASED_OUTPATIENT_CLINIC_OR_DEPARTMENT_OTHER): Payer: Self-pay | Admitting: *Deleted

## 2024-02-10 DIAGNOSIS — I2510 Atherosclerotic heart disease of native coronary artery without angina pectoris: Secondary | ICD-10-CM | POA: Diagnosis not present

## 2024-02-10 DIAGNOSIS — Z87891 Personal history of nicotine dependence: Secondary | ICD-10-CM

## 2024-02-10 DIAGNOSIS — R600 Localized edema: Secondary | ICD-10-CM

## 2024-02-10 DIAGNOSIS — E782 Mixed hyperlipidemia: Secondary | ICD-10-CM

## 2024-02-10 DIAGNOSIS — I7789 Other specified disorders of arteries and arterioles: Secondary | ICD-10-CM

## 2024-02-10 DIAGNOSIS — K760 Fatty (change of) liver, not elsewhere classified: Secondary | ICD-10-CM

## 2024-02-10 DIAGNOSIS — R931 Abnormal findings on diagnostic imaging of heart and coronary circulation: Secondary | ICD-10-CM | POA: Diagnosis not present

## 2024-02-10 MED ORDER — IOHEXOL 350 MG/ML SOLN
95.0000 mL | Freq: Once | INTRAVENOUS | Status: AC | PRN
  Administered 2024-02-10: 95 mL via INTRAVENOUS

## 2024-02-10 MED ORDER — NITROGLYCERIN 0.4 MG SL SUBL
0.8000 mg | SUBLINGUAL_TABLET | Freq: Once | SUBLINGUAL | Status: AC
  Administered 2024-02-10: 0.8 mg via SUBLINGUAL

## 2024-02-11 ENCOUNTER — Other Ambulatory Visit: Payer: Self-pay | Admitting: Cardiology

## 2024-02-11 ENCOUNTER — Inpatient Hospital Stay (INDEPENDENT_AMBULATORY_CARE_PROVIDER_SITE_OTHER)
Admission: RE | Admit: 2024-02-11 | Discharge: 2024-02-11 | Disposition: A | Discharge status: Home / Self Care | Payer: Self-pay | Source: Ambulatory Visit | Attending: Cardiology | Admitting: Cardiology

## 2024-02-11 DIAGNOSIS — R931 Abnormal findings on diagnostic imaging of heart and coronary circulation: Secondary | ICD-10-CM

## 2024-02-11 NOTE — Progress Notes (Signed)
 FFR order

## 2024-02-13 ENCOUNTER — Encounter: Payer: Self-pay | Admitting: Cardiology

## 2024-02-15 ENCOUNTER — Ambulatory Visit: Payer: Self-pay | Admitting: Cardiology

## 2024-02-16 ENCOUNTER — Ambulatory Visit: Attending: Cardiology

## 2024-02-16 DIAGNOSIS — E782 Mixed hyperlipidemia: Secondary | ICD-10-CM | POA: Diagnosis not present

## 2024-02-16 DIAGNOSIS — K760 Fatty (change of) liver, not elsewhere classified: Secondary | ICD-10-CM

## 2024-02-16 DIAGNOSIS — R931 Abnormal findings on diagnostic imaging of heart and coronary circulation: Secondary | ICD-10-CM | POA: Diagnosis not present

## 2024-02-16 DIAGNOSIS — R600 Localized edema: Secondary | ICD-10-CM

## 2024-02-16 DIAGNOSIS — Z87891 Personal history of nicotine dependence: Secondary | ICD-10-CM

## 2024-02-16 DIAGNOSIS — I7789 Other specified disorders of arteries and arterioles: Secondary | ICD-10-CM

## 2024-02-16 LAB — ECHOCARDIOGRAM COMPLETE
Area-P 1/2: 2.18 cm2
S' Lateral: 2.9 cm

## 2024-02-29 NOTE — Progress Notes (Unsigned)
 Cardiology Office Note:    Date:  03/01/2024   ID:  Jeffery Browning, DOB 1950-08-16, MRN [id number]  PCP:  Iven [name], PA-C  Cardiologist:  [name], MD    Referring MD: Iven [name], PA-C    ASSESSMENT:    1. Coronary artery disease of native artery of native heart with stable angina pectoris   2. Agatston coronary artery calcium  score greater than 400   3. Elevated lipoprotein(a)   4. Hepatic steatosis   5. Ascending aorta enlargement   6. Edema of both lower extremities   7. Nonrheumatic aortic valve insufficiency    PLAN:    In order of problems listed above:  He has CAD but not flow-limiting stenosis moderate LAD and diagonal medical treatment aspirin  statin lowering therapy with elevated LP(a) adding Repatha  goal LDL less than 50 April be less than 45 and with coincident enlargement ascending aorta aneurysm add beta-blocker goal blood pressure less than 120 systolic heart rate less than 34-29 at rest Follow-up lipids 2 months Elopes CT noncontrast gated 1 year   Next appointment: See me in my office in 1 year   Medication Adjustments/Labs and Tests Ordered: Current medicines are reviewed at length with the patient today.  Concerns regarding medicines are outlined above.  No orders of the defined types were placed in this encounter.  No orders of the defined types were placed in this encounter.    History of Present Illness:    Jeffery Browning is a 73 y.o. male with a hx of very high coronary calcium  score 771/79th percentile hepatic steatosis hyperlipidemia lower extremity edema and enlargement of ascending aorta.  He wants last seen 01/07/2024.  He underwent cardiac CTA showing moderate stenosis LAD and the first diagonal branch with normal FFR and aneurysm ascending aorta 45 mm  Lipid analysis showed severely elevated LP(a) 387 with a lipid profile with LDL of 92 cholesterol 187 and non-HDL cholesterol quite elevated 132 vascular screen showed  no finding of aortic aneurysm normal ABIs and mild plaque in the carotid artery bilaterally.  Echocardiogram showed normal left ventricular size wall thickness ejection fraction and filling pressure.  There is mild aortic regurgitation.  He has had a vascular screen no evidence of abdominal aortic aneurysm ABIs were normal bilaterally and he had very mild plaque in the internal carotid artery bilaterally  Compliance with diet, lifestyle and medications: Yes  Complicated visit address many issues He is not having anginal discomfort however with enlargement of his ascending aorta he should start beta-blocker therapy continue aspirin  and lipid-lowering For general measure avoid Cipro and Levaquin as it can cause progression of aortic aneurysm He is not having shortness of breath edema palpitation or syncope His residual APO B and LDL is elevated add Repatha  follow-up 2 months lipid profile ApoB and LPA level Follow-up CT of the chest gated noncontrast in 1 year and see me afterwards for his ascending aortic aneurysm Past Medical History:  Diagnosis Date   Agatston coronary artery calcium  score greater than 400- score 771 12/30/2023   Alcohol abuse, continuous 11/25/2023   Alcohol use disorder, moderate, in early remission (HCC) 12/16/2023   Aneurysm of ascending aorta without rupture 12/30/2023   Confusion 11/25/2023   Hepatic steatosis 11/25/2023   Multiple joint pain 06/05/2016   Pedal edema 12/16/2023   Positive ANA (antinuclear antibody) 06/05/2016   Psoriasis 12/16/2023   Rash 11/25/2023    Current Medications: Current Meds  Medication Sig   aspirin  EC 81 MG tablet  Take 1 tablet (81 mg total) by mouth daily. Swallow whole.   rosuvastatin  (CRESTOR ) 20 MG tablet Take 1 tablet (20 mg total) by mouth daily.   triamcinolone ointment (KENALOG) 0.1 % Apply 1 Application topically daily.      EKGs/Labs/Other Studies Reviewed:    The following studies were reviewed today:  Cardiac  Studies & Procedures   ______________________________________________________________________________________________     ECHOCARDIOGRAM  ECHOCARDIOGRAM COMPLETE 02/16/2024  Narrative ECHOCARDIOGRAM REPORT    Patient Name:   Jeffery Browning Date of Exam: 02/16/2024 Medical Rec #:  [id number]        Height:       70.0 in Accession #:    [id number]       Weight:       208.3 lb Date of Birth:  1950/08/10        BSA:          2.124 m Patient Age:    73 years         BP:           122/83 mmHg Patient Gender: M                HR:           73 bpm. Exam Location:  [place]  Procedure: 2D Echo, Cardiac Doppler, Color Doppler and Strain Analysis (Both Spectral and Color Flow Doppler were utilized during procedure).  Indications:    Agatston coronary artery calcium  score greater than 400 [R93.1 (ICD-10-CM)], Hepatic steatosis [K76.0 (ICD-10-CM)], Mixed hyperlipidemia [E78.2 (ICD-10-CM)], Edema of both lower extremities [R60.0 (ICD-10-CM)], Ascending aorta enlargement [I77.89 (ICD-10-CM)], Ex-smoker [S12.108 (ICD-10-CM)]  History:        Patient has no prior history of Echocardiogram examinations. CAD, Signs/Symptoms:Edema; Risk Factors:Dyslipidemia.  Sonographer:    Charlie Jointer RDCS Referring Phys: [id number] Adrea Sherpa J Steve Gregg  IMPRESSIONS   1. Left ventricular ejection fraction, by estimation, is 60 to 65%. The left ventricle has normal function. The left ventricle has no regional wall motion abnormalities. Left ventricular diastolic parameters are consistent with Grade I diastolic dysfunction (impaired relaxation). The average left ventricular global longitudinal strain is borderline normal -17.2 %. 2. Right ventricular systolic function is normal. The right ventricular size is normal. 3. Left atrial size was mildly dilated. 4. The mitral valve is normal in structure. No evidence of mitral valve regurgitation. No evidence of mitral stenosis. 5. The aortic valve is tricuspid. Aortic  valve regurgitation is mild. Aortic valve sclerosis is present, with no evidence of aortic valve stenosis. 6. There is moderate dilatation of the ascending aorta and of the aortic root, measuring 43 mm. 7. The inferior vena cava is normal in size with greater than 50% respiratory variability, suggesting right atrial pressure of 3 mmHg.  FINDINGS Left Ventricle: Left ventricular ejection fraction, by estimation, is 60 to 65%. The left ventricle has normal function. The left ventricle has no regional wall motion abnormalities. The average left ventricular global longitudinal strain is -17.2 %. The left ventricular internal cavity size was normal in size. There is no left ventricular hypertrophy. Left ventricular diastolic parameters are consistent with Grade I diastolic dysfunction (impaired relaxation). Indeterminate filling pressures.  Right Ventricle: The right ventricular size is normal. No increase in right ventricular wall thickness. Right ventricular systolic function is normal.  Left Atrium: Left atrial size was mildly dilated.  Right Atrium: Right atrial size was normal in size.  Pericardium: There is no evidence of pericardial effusion.  Mitral Valve: The mitral valve is normal  in structure. No evidence of mitral valve regurgitation. No evidence of mitral valve stenosis.  Tricuspid Valve: The tricuspid valve is normal in structure. Tricuspid valve regurgitation is not demonstrated. No evidence of tricuspid stenosis.  Aortic Valve: The aortic valve is tricuspid. Aortic valve regurgitation is mild. Aortic valve sclerosis is present, with no evidence of aortic valve stenosis.  Pulmonic Valve: The pulmonic valve was normal in structure. Pulmonic valve regurgitation is not visualized. No evidence of pulmonic stenosis.  Aorta: The aortic arch was not well visualized. There is moderate dilatation of the ascending aorta and of the aortic root, measuring 43 mm.  Venous: The right upper  pulmonary vein is normal. The inferior vena cava is normal in size with greater than 50% respiratory variability, suggesting right atrial pressure of 3 mmHg.  IAS/Shunts: No atrial level shunt detected by color flow Doppler.   LEFT VENTRICLE PLAX 2D LVIDd:         4.60 cm   Diastology LVIDs:         2.90 cm   LV e' medial:    4.57 cm/s LV PW:         1.00 cm   LV E/e' medial:  13.5 LV IVS:        1.20 cm   LV e' lateral:   3.81 cm/s LVOT diam:     2.10 cm   LV E/e' lateral: 16.2 LV SV:         64 LV SV Index:   30        2D Longitudinal Strain LVOT Area:     3.46 cm  2D Strain GLS Avg:     -17.2 %   RIGHT VENTRICLE             IVC RV Basal diam:  3.70 cm     IVC diam: 1.80 cm RV Mid diam:    3.30 cm RV S prime:     14.10 cm/s TAPSE (M-mode): 3.2 cm  LEFT ATRIUM              Index        RIGHT ATRIUM           Index LA diam:        4.60 cm  2.17 cm/m   RA Area:     19.70 cm LA Vol (A2C):   53.8 ml  25.33 ml/m  RA Volume:   51.10 ml  24.06 ml/m LA Vol (A4C):   103.0 ml 48.50 ml/m LA Biplane Vol: 75.5 ml  35.55 ml/m AORTIC VALVE LVOT Vmax:   82.45 cm/s LVOT Vmean:  53.350 cm/s LVOT VTI:    0.186 m  AORTA Ao Root diam: 4.30 cm Ao Asc diam:  4.30 cm  MITRAL VALVE MV Area (PHT): 2.18 cm    SHUNTS MV Decel Time: 349 msec    Systemic VTI:  0.19 m MV E velocity: 61.80 cm/s  Systemic Diam: 2.10 cm MV A velocity: 79.10 cm/s MV E/A ratio:  0.78  [name] MD Electronically signed by [name] MD Signature Date/Time: 02/16/2024/5:36:38 PM    Final      CT SCANS  CT CORONARY MORPH W/CTA COR W/SCORE 02/10/2024  Addendum 02/16/2024  9:40 PM ADDENDUM REPORT: 02/16/2024 21:37  EXAM: OVER-READ INTERPRETATION  CT CHEST  The following report is an over-read performed by radiologist Dr. Oneil Devonshire of [facility] on 02/16/2024. This over-read does not include interpretation of cardiac or coronary anatomy or pathology. The coronary  calcium   score/coronary CTA interpretation by the cardiologist is attached.  COMPARISON:  12/17/2023  FINDINGS: Cardiovascular: Dilatation of the ascending aorta is again identified at 4.4 cm. No dissection is noted.  Mediastinum/Nodes: There are no enlarged lymph nodes within the visualized mediastinum.  Lungs/Pleura: There is no pleural effusion. Lungs are well aerated bilaterally. Mild emphysematous changes are seen.  Upper abdomen: Scattered cysts are noted within the liver stable from the prior exam.  Musculoskeletal/Chest wall: No chest wall mass or suspicious osseous findings within the visualized chest.  IMPRESSION: Dilatation of the ascending aorta to 4.4 cm. Recommend annual imaging followup by CTA or MRA. This recommendation follows 2010 ACCF/AHA/AATS/ACR/ASA/SCA/SCAI/SIR/STS/SVM Guidelines for the Diagnosis and Management of Patients with Thoracic Aortic Disease. Circulation. 2010; 121: Z733-z630. Aortic aneurysm NOS (ICD10-I71.9)  Emphysematous changes.   Electronically Signed By: Oneil Devonshire M.D. On: 02/16/2024 21:37  Narrative CLINICAL DATA:  CP  EXAM: Cardiac/Coronary  CTA  TECHNIQUE: The patient was scanned on a GE Apex scanner.  FINDINGS: A 120 kV prospective scan was triggered in the descending thoracic aorta at 111 HU's. Axial non-contrast 3 mm slices were carried out through the heart. The data set was analyzed on a dedicated work station and scored using the Agatson method. Gantry rotation speed was 250 msecs and collimation was .6 mm. No beta blockade and 0.8 mg of sl NTG was given. The 3D data set was reconstructed at 74% of the R-R cycle. Diastolic phases were analyzed on a dedicated work station using MPR, MIP and VRT modes. The patient received 80 cc of contrast.  Aorta: Ascending aorta aneurysm - 45 mm. Mild calcifications. No dissection.  Aortic Valve:  Trileaflet.  No calcifications.  Coronary Arteries:  Normal coronary origin.  Left  dominance.  RCA is a small, non dominant artery. There is mostly calcified plaque in the proximal portion of this artery with mild stenosis of 25-49%.  Left main is a large artery that gives rise to LAD and LCX arteries.  LAD is a large vessel that has mostly calcified plaque in its proximal portion with moderate stenosis of 50-69%. In the mid and distal portion of this artery there are few mostly calcified plaques with mild stenosis of 25-49%. This artery gives rise to large D1. In the proximal portion of this artery there is mixed plaque with moderate stenosis of 50-69%.  LCX is a dominant artery that gives rise to one large OM1 and small OM2 branches as well as PDA and PLA. There is mostly calcified plaque in the mid portion of this artery with mild stenosis of 25-49%.  Other findings:  Normal pulmonary vein drainage into the left atrium.  Normal left atrial appendage without a thrombus.  Normal size of the pulmonary artery.  IMPRESSION: 1. Coronary calcium  score of 790. This was 63 percentile for age and sex matched control.  2. Normal coronary origin with left dominance.  3. CAD-RADS 3. Moderate stenosis. (50-69%) prox, mid LAD, D1. Consider symptom-guided anti-ischemic pharmacotherapy as well as risk factor modification per guideline directed care. Additional analysis with CT FFR will be submitted.  4. Ascending aorta aneurysm - 45 mm.  Electronically Signed: By: Lamar Fitch M.D. On: 02/11/2024 17:20   CT SCANS  CT CARDIAC SCORING (SELF PAY ONLY) 12/17/2023  Addendum 12/29/2023  5:32 PM ADDENDUM REPORT: 12/29/2023 17:29  EXAM: OVER-READ INTERPRETATION  CT CHEST  The following report is an over-read performed by radiologist Dr. Andrea Gasman of [facility] on 12/29/2023. This over-read  does not include interpretation of cardiac or coronary anatomy or pathology. The coronary calcium  score interpretation by the cardiologist is  attached.  COMPARISON:  None.  FINDINGS: Vascular: Aortic atherosclerosis. The ascending aorta is dilated to 4.4 cm. The descending aorta is tortuous.  Mediastinum/nodes: There are multiple small lymph nodes adjacent to the distal esophagus. Equivocal and distal esophageal wall thickening. Unremarkable esophagus.  Lungs: Subpleural scarring in the medial right lower lobe adjacent to thoracic spine osteophytes. Subsegmental atelectasis in the lower lobes. No focal airspace disease. No pulmonary nodule. No pleural fluid. The included airways are patent.  Upper abdomen: No acute findings. Mild diffuse hepatic steatosis. Water density lesions in the liver typical of cysts.  Musculoskeletal: There are no acute or suspicious osseous abnormalities. Thoracic spondylosis.  IMPRESSION: 1. Ascending aortic aneurysm at 4.4 cm. Recommend annual imaging followup by CTA or MRA. This recommendation follows 2010 ACCF/AHA/AATS/ACR/ASA/SCA/SCAI/SIR/STS/SVM Guidelines for the Diagnosis and Management of Patients with Thoracic Aortic Disease. Circulation. 2010; 121: [id number]. Aortic aneurysm NOS (ICD10-I71.9) 2. Equivocal distal esophageal wall thickening with multiple small lymph nodes adjacent to the distal esophagus. Recommend correlation for symptoms of esophagitis/reflux. Consider endoscopy. 3. Hepatic steatosis.  Aortic Atherosclerosis (ICD10-I70.0).   Electronically Signed By: Andrea Gasman M.D. On: 12/29/2023 17:29  Narrative : CLINICAL DATA:  Cardiovascular Disease Risk stratification  EXAM:  Coronary Calcium  Score  TECHNIQUE:  A gated, non-contrast computed tomography scan of the heart was  performed using 3mm slice thickness. Axial images were analyzed on a  dedicated workstation. Calcium  scoring of the coronary arteries was  performed using the Agatston method.  FINDINGS:  Coronary Calcium  Score:  Left main: 0  Left anterior descending artery: 575  Left  circumflex artery: 141  Right coronary artery: 54.4  Total: 771  Pericardium: Normal.  Ascending Aorta: Normal caliber.  Aortic valve - mild calcifications noted.  Pulmonary artery: Normal caliber  Non-cardiac: See separate report from [facility].  IMPRESSION:  Coronary calcium  score of 771. This was 54 percentile for age-, race-,  and sex-matched controls.  RECOMMENDATIONS:  Coronary artery calcium  (CAC) score is a strong predictor of  incident coronary heart disease (CHD) and provides predictive  information beyond traditional risk factors. CAC scoring is  reasonable to use in the decision to withhold, postpone, or initiate  statin therapy in intermediate-risk or selected borderline-risk  asymptomatic adults (age 75-75 years and LDL-C >=70 to <190 mg/dL)  who do not have diabetes or established atherosclerotic  cardiovascular disease (ASCVD).* In intermediate-risk (10-year ASCVD  risk >=7.5% to <20%) adults or selected borderline-risk (10-year  ASCVD risk >=5% to <7.5%) adults in whom a CAC score is measured for  the purpose of making a treatment decision the following  recommendations have been made:  If CAC=0, it is reasonable to withhold statin therapy and reassess  in 5 to 10 years, as long as higher risk conditions are absent  (diabetes mellitus, family history of premature CHD in first degree  relatives (males <55 years; females <65 years), cigarette smoking,  or LDL >=190 mg/dL).  If CAC is 1 to 99, it is reasonable to initiate statin therapy for  patients >=34 years of age.  If CAC is >=100 or >=75th percentile, it is reasonable to initiate  statin therapy at any age.  Cardiology referral should be considered for patients with CAC  scores >=400 or >=75th percentile.  *2018 AHA/ACC/AACVPR/AAPA/ABC/ACPM/ADA/AGS/APhA/ASPC/NLA/PCNA  Guideline on the Management of Blood Cholesterol: A Report of the  [employer] of  Cardiology/American  Heart Association Task Force  on Clinical Practice Guidelines. J Am Coll Cardiol.  2019;73(24):3168-3209.  Electronically Signed: By: Lamar Fitch M.D. On: 12/17/2023 17:39     ______________________________________________________________________________________________          Recent Labs: 11/21/2023: TSH 0.840 11/25/2023: Hemoglobin 13.8; Platelets 104 01/07/2024: ALT 26; BUN 11; Creatinine, Ser 0.83; Potassium 3.7; Sodium 137  Recent Lipid Panel    Component Value Date/Time   CHOL 187 01/07/2024 1152   TRIG 303 (H) 01/07/2024 1152   HDL 45 01/07/2024 1152   CHOLHDL 4.2 01/07/2024 1152   LDLCALC 92 01/07/2024 1152    Physical Exam:    VS:  BP (!) 140/86   Pulse 90   Ht 5' 10 (1.778 m)   Wt 209 lb 3.2 oz (94.9 kg)   SpO2 96%   BMI 30.02 kg/m     Wt Readings from Last 3 Encounters:  03/01/24 209 lb 3.2 oz (94.9 kg)  01/26/24 208 lb 4.8 oz (94.5 kg)  01/07/24 207 lb 6.4 oz (94.1 kg)     GEN:  Well nourished, well developed in no acute distress HEENT: Normal NECK: No JVD; No carotid bruits LYMPHATICS: No lymphadenopathy CARDIAC: RRR, no murmurs, rubs, gallops RESPIRATORY:  Clear to auscultation without rales, wheezing or rhonchi  ABDOMEN: Soft, non-tender, non-distended MUSCULOSKELETAL:  No edema; No deformity  SKIN: Warm and dry NEUROLOGIC:  Alert and oriented x 3 PSYCHIATRIC:  Normal affect    Signed, [name], MD  03/01/2024 3:30 PM    [facility]

## 2024-03-01 ENCOUNTER — Encounter: Payer: Self-pay | Admitting: Cardiology

## 2024-03-01 ENCOUNTER — Ambulatory Visit: Attending: Cardiology | Admitting: Cardiology

## 2024-03-01 VITALS — BP 140/86 | HR 90 | Ht 70.0 in | Wt 209.2 lb

## 2024-03-01 DIAGNOSIS — I25118 Atherosclerotic heart disease of native coronary artery with other forms of angina pectoris: Secondary | ICD-10-CM

## 2024-03-01 DIAGNOSIS — I7789 Other specified disorders of arteries and arterioles: Secondary | ICD-10-CM

## 2024-03-01 DIAGNOSIS — R931 Abnormal findings on diagnostic imaging of heart and coronary circulation: Secondary | ICD-10-CM | POA: Diagnosis not present

## 2024-03-01 DIAGNOSIS — K760 Fatty (change of) liver, not elsewhere classified: Secondary | ICD-10-CM

## 2024-03-01 DIAGNOSIS — R600 Localized edema: Secondary | ICD-10-CM

## 2024-03-01 DIAGNOSIS — I351 Nonrheumatic aortic (valve) insufficiency: Secondary | ICD-10-CM

## 2024-03-01 DIAGNOSIS — E7841 Elevated Lipoprotein(a): Secondary | ICD-10-CM | POA: Diagnosis not present

## 2024-03-01 MED ORDER — METOPROLOL SUCCINATE ER 25 MG PO TB24: 25.0000 mg | ORAL_TABLET | Freq: Every day | ORAL | 3 refills | Status: AC

## 2024-03-01 MED ORDER — REPATHA SURECLICK 140 MG/ML ~~LOC~~ SOAJ: 140.0000 mg | SUBCUTANEOUS | 12 refills | Status: AC

## 2024-03-01 MED ORDER — NITROGLYCERIN 0.4 MG SL SUBL: 0.4000 mg | SUBLINGUAL_TABLET | SUBLINGUAL | 1 refills | Status: AC | PRN

## 2024-03-01 NOTE — Patient Instructions (Signed)
 Medication Instructions:  Your physician has recommended you make the following change in your medication:   START: Toprol  XL 25 mg daily START: Nitroglycerin  0.4 mg under the tongue every 5 minutes x 3 doses as needed for chest pain  START: Repatha  140 mg per ml every 2 weeks  *If you need a refill on your cardiac medications before your next appointment, please call your pharmacy*  Lab Work: Your physician recommends that you return for lab work in:   Labs in 2 months: Lipids, Lpa, Apo B  If you have labs (blood work) drawn today and your tests are completely normal, you will receive your results only by: MyChart Message (if you have MyChart) OR A paper copy in the mail If you have any lab test that is abnormal or we need to change your treatment, we will call you to review the results.  Testing/Procedures: Non-Cardiac CT scanning, (CAT scanning), is a noninvasive, special x-ray that produces cross-sectional images of the body using x-rays and a computer. CT scans help physicians diagnose and treat medical conditions. For some CT exams, a contrast material is used to enhance visibility in the area of the body being studied. CT scans provide greater clarity and reveal more details than regular x-ray exams.   Follow-Up: At [facility], you and your health needs are our priority.  As part of our continuing mission to provide you with exceptional heart care, our providers are all part of one team.  This team includes your primary Cardiologist (physician) and Advanced Practice Providers or APPs (Physician Assistants and Nurse Practitioners) who all work together to provide you with the care you need, when you need it.  Your next appointment:   1 year(s)  Provider:   [name], MD   We recommend signing up for the patient portal called MyChart.  Sign up information is provided on this After Visit Summary.  MyChart is used to connect with patients for Virtual Visits  (Telemedicine).  Patients are able to view lab/test results, encounter notes, upcoming appointments, etc.  Non-urgent messages can be sent to your provider as well.   To learn more about what you can do with MyChart, go to [email or web address].   Other Instructions Children and siblings need to be screened for Lpa

## 2024-03-07 NOTE — Telephone Encounter (Signed)
 Repatha  may need PA approval. Please assess coverage for Repatha .

## 2024-03-08 ENCOUNTER — Other Ambulatory Visit (HOSPITAL_COMMUNITY): Payer: Self-pay

## 2024-03-08 NOTE — Telephone Encounter (Signed)
 Pharmacy Patient Advocate Encounter   Received notification from Patient Pharmacy that prior authorization for REPATHA  is required/requested.   Insurance verification completed.   The patient is insured through [facility] ADVANTAGE/RX ADVANCE.   Per test claim: PA required; PA submitted to above mentioned insurance via Latent Key/confirmation #/EOC [facility] Status is pending

## 2024-03-14 ENCOUNTER — Other Ambulatory Visit (HOSPITAL_COMMUNITY): Payer: Self-pay

## 2024-03-14 NOTE — Telephone Encounter (Signed)
 Pharmacy Patient Advocate Encounter  Received notification from [employer] that Prior Authorization for REPATHA  has been APPROVED from 03/10/24 to 09/06/24. Ran test claim, Copay is $47. This test claim was processed through [facility]- copay amounts may vary at other pharmacies due to pharmacy/plan contracts, or as the patient moves through the different stages of their insurance plan.

## 2024-03-22 ENCOUNTER — Telehealth: Payer: Self-pay | Admitting: Cardiology

## 2024-03-22 ENCOUNTER — Other Ambulatory Visit: Payer: Self-pay

## 2024-03-22 NOTE — Telephone Encounter (Signed)
Patient was returning your call. Please advise 

## 2024-03-23 NOTE — Telephone Encounter (Signed)
 Returned patient's call. NR-LVM. Also sent mychart message on 03/20/2024 informing patient Repatha  copay is $47/month and prescription is at the pharmacy to be filled.

## 2024-03-24 NOTE — Telephone Encounter (Signed)
 Called patient's wife and pharmacy; [facility] did not have furniture conservator/restorer on file. Provided insurance info and Repatha  went through for 47/month. Called and relayed info to wife

## 2024-03-24 NOTE — Telephone Encounter (Signed)
 Pt c/o medication issue:  1. Name of Medication:   Evolocumab  (REPATHA  SURECLICK) 140 MG/ML SOAJ   2. How are you currently taking this medication (dosage and times per day)?   3. Are you having a reaction (difficulty breathing--STAT)?   4. What is your medication issue?    Wife Neal) stated they went to [id number] - [place], [place] - [address] and they told her the medication is over $600.  Wife wants a call back to confirm reduced rate sent to [facility] or advice on next steps.

## 2024-03-28 NOTE — Telephone Encounter (Signed)
 Returned patient's call and discussed with him that [facility] did not have insurance info and I provided it to them and Repatha  went through at $47/month. He reports that he p/u medication recently.

## 2024-03-28 NOTE — Telephone Encounter (Signed)
Patient returned pharmacist call.

## 2024-04-26 ENCOUNTER — Ambulatory Visit (INDEPENDENT_AMBULATORY_CARE_PROVIDER_SITE_OTHER): Admitting: *Deleted

## 2024-04-26 DIAGNOSIS — Z23 Encounter for immunization: Secondary | ICD-10-CM | POA: Diagnosis not present

## 2024-04-26 NOTE — Progress Notes (Unsigned)
 Patient is in office today for a nurse visit for Immunization. Patient Injection was given in the  Right deltoid. Patient tolerated injection well.

## 2024-07-26 ENCOUNTER — Ambulatory Visit (HOSPITAL_BASED_OUTPATIENT_CLINIC_OR_DEPARTMENT_OTHER): Admitting: Student
